# Patient Record
Sex: Female | Born: 1975 | Race: Black or African American | Hispanic: No | Marital: Married | Smoking: Never smoker
Health system: Southern US, Community
[De-identification: ages and names within clinical notes are randomized; demographics above are authoritative.]

## PROBLEM LIST (undated history)

## (undated) DIAGNOSIS — E785 Hyperlipidemia, unspecified: Secondary | ICD-10-CM

## (undated) DIAGNOSIS — L709 Acne, unspecified: Secondary | ICD-10-CM

## (undated) DIAGNOSIS — R55 Syncope and collapse: Secondary | ICD-10-CM

## (undated) DIAGNOSIS — I10 Essential (primary) hypertension: Secondary | ICD-10-CM

## (undated) DIAGNOSIS — D219 Benign neoplasm of connective and other soft tissue, unspecified: Secondary | ICD-10-CM

## (undated) DIAGNOSIS — N63 Unspecified lump in unspecified breast: Secondary | ICD-10-CM

## (undated) DIAGNOSIS — O1490 Unspecified pre-eclampsia, unspecified trimester: Secondary | ICD-10-CM

## (undated) DIAGNOSIS — B019 Varicella without complication: Secondary | ICD-10-CM

## (undated) HISTORY — DX: Acne, unspecified: L70.9

## (undated) HISTORY — DX: Varicella without complication: B01.9

## (undated) HISTORY — DX: Hyperlipidemia, unspecified: E78.5

## (undated) HISTORY — DX: Unspecified lump in unspecified breast: N63.0

## (undated) HISTORY — DX: Essential (primary) hypertension: I10

## (undated) HISTORY — DX: Syncope and collapse: R55

## (undated) HISTORY — DX: Benign neoplasm of connective and other soft tissue, unspecified: D21.9

## (undated) HISTORY — DX: Unspecified pre-eclampsia, unspecified trimester: O14.90

## (undated) SURGERY — Surgical Case
Anesthesia: Epidural

---

## 1997-09-04 ENCOUNTER — Other Ambulatory Visit: Admission: RE | Admit: 1997-09-04 | Discharge: 1997-09-04 | Payer: Self-pay | Admitting: Obstetrics & Gynecology

## 1998-09-04 ENCOUNTER — Other Ambulatory Visit: Admission: RE | Admit: 1998-09-04 | Discharge: 1998-09-04 | Payer: Self-pay | Admitting: Obstetrics & Gynecology

## 2001-05-16 DIAGNOSIS — I10 Essential (primary) hypertension: Secondary | ICD-10-CM

## 2001-05-16 DIAGNOSIS — O1490 Unspecified pre-eclampsia, unspecified trimester: Secondary | ICD-10-CM

## 2001-05-16 HISTORY — DX: Unspecified pre-eclampsia, unspecified trimester: O14.90

## 2001-05-16 HISTORY — DX: Essential (primary) hypertension: I10

## 2001-10-30 ENCOUNTER — Inpatient Hospital Stay (HOSPITAL_COMMUNITY): Admission: AD | Admit: 2001-10-30 | Discharge: 2001-11-03 | Payer: Self-pay | Admitting: Obstetrics and Gynecology

## 2010-12-15 ENCOUNTER — Other Ambulatory Visit (HOSPITAL_COMMUNITY)
Admission: RE | Admit: 2010-12-15 | Discharge: 2010-12-15 | Disposition: A | Discharge status: Home / Self Care | Payer: BC Managed Care – PPO | Source: Ambulatory Visit | Attending: Family Medicine | Admitting: Family Medicine

## 2010-12-15 ENCOUNTER — Ambulatory Visit (INDEPENDENT_AMBULATORY_CARE_PROVIDER_SITE_OTHER): Payer: BC Managed Care – PPO | Admitting: Family Medicine

## 2010-12-15 ENCOUNTER — Encounter: Payer: Self-pay | Admitting: Family Medicine

## 2010-12-15 VITALS — BP 120/82 | HR 66 | Temp 98.5°F | Ht 62.0 in | Wt 155.0 lb

## 2010-12-15 DIAGNOSIS — I10 Essential (primary) hypertension: Secondary | ICD-10-CM

## 2010-12-15 DIAGNOSIS — E785 Hyperlipidemia, unspecified: Secondary | ICD-10-CM

## 2010-12-15 DIAGNOSIS — Z0000 Encounter for general adult medical examination without abnormal findings: Secondary | ICD-10-CM

## 2010-12-15 DIAGNOSIS — Z01419 Encounter for gynecological examination (general) (routine) without abnormal findings: Secondary | ICD-10-CM | POA: Insufficient documentation

## 2010-12-15 MED ORDER — MINOCYCLINE HCL 50 MG PO TABS: 50.0000 mg | ORAL_TABLET | Freq: Two times a day (BID) | ORAL | Status: AC

## 2010-12-15 NOTE — Progress Notes (Signed)
Rebekah Brady, a 35 y.o. female presents today in the office for the following:    HTN, stable, compliant  PET when pregnany  Lost about forty pounds in the last few years.  Has had some plantar fascitis Fibroids on uterus  Walks a lot of cardio  Health Maintenance Summary Reviewed and updated, unless pt declines services.  Tobacco History Reviewed. Non-smoker Alcohol: No concerns, no excessive use Exercise Habits: Some activity, rec at least 30 mins 5 times a week Drug Use: None Birth control method: withdrawal Menses regular: yes - 23-28 day cycle Lumps or breast concerns: no Breast Cancer Family History: no  The PMH, PSH, Social History, Family History, Medications, and allergies have been reviewed in [facility], and have been updated if relevant.  General: Denies fever, chills, sweats. No significant weight loss. Eyes: Denies blurring,significant itching ENT: Denies earache, sore throat, and hoarseness.  Cardiovascular: Denies chest pains, palpitations, dyspnea on exertion,  Respiratory: Denies cough, dyspnea at rest,wheeezing Breast: no concerns about lumps GI: Denies nausea, vomiting, diarrhea, constipation, change in bowel habits, abdominal pain, melena, hematochezia GU: Denies dysuria, hematuria, urinary hesitancy, nocturia, denies STD risk, no concerns about discharge Musculoskeletal: Denies back pain, joint pain Derm: Denies rash, itching Neuro: Denies  paresthesias, frequent falls, frequent headaches Psych: Denies depression, anxiety Endocrine: Denies cold intolerance, heat intolerance, polydipsia Heme: Denies enlarged lymph nodes Allergy: No hayfever   Physical Exam  Blood pressure 120/82, pulse 66, temperature 98.5 F (36.9 C), temperature source Oral, height 5\' 2"  (1.575 m), weight 155 lb (70.308 kg), last menstrual period 12/15/2010, SpO2 99.00%.  GEN: well developed, well nourished, no acute distress Eyes: conjunctiva and lids normal, PERRLA, EOMI ENT: TM  clear, nares clear, oral exam WNL Neck: supple, no lymphadenopathy, no thyromegaly, no JVD Pulm: clear to auscultation and percussion, respiratory effort normal CV: regular rate and rhythm, S1-S2, no murmur, rub or gallop, no bruits, peripheral pulses normal and symmetric, no cyanosis, clubbing, edema or varicosities Chest: no scars, masses, no gynecomastia   BREAST: no lumps, no axillary LAD, no nipple discharge GI: soft, non-tender; no hepatosplenomegaly, masses; active bowel sounds all quadrants GU: Normal external female genitalia. Cervix appears intact without lesions or irritation. Vaginal canal normal without ulceration or lesion. Cervix NT to exam. Ovaries neither enlarged nor tender. Lymph: no cervical, axillary or inguinal adenopathy MSK: gait normal, muscle tone and strength WNL, no joint swelling, effusions, discoloration, crepitus  SKIN: clear, good turgor, color WNL, no rashes, lesions, or ulcerations Neuro: normal mental status, normal strength, sensation, and motion Psych: alert; oriented to person, place and time, normally interactive and not anxious or depressed in appearance.  Assessment and Plan: 1.  Health Maintenance: The patient's preventative maintenance and recommended screening tests for an annual wellness exam were reviewed in full today. Brought up to date unless services declined.  Counselled on the importance of diet, exercise, and its role in overall health and mortality. The patient's FH and SH was reviewed, including their home life, tobacco status, and drug and alcohol status. Pap and breast exam  2. Acne, reasonable to do a 3 month cycle of minocin

## 2010-12-17 ENCOUNTER — Encounter: Payer: Self-pay | Admitting: *Deleted

## 2011-03-24 ENCOUNTER — Encounter: Payer: Self-pay | Admitting: Family Medicine

## 2011-03-24 ENCOUNTER — Ambulatory Visit (INDEPENDENT_AMBULATORY_CARE_PROVIDER_SITE_OTHER): Payer: BC Managed Care – PPO | Admitting: Family Medicine

## 2011-03-24 VITALS — BP 126/78 | HR 72 | Temp 99.1°F | Wt 155.4 lb

## 2011-03-24 DIAGNOSIS — N898 Other specified noninflammatory disorders of vagina: Secondary | ICD-10-CM

## 2011-03-24 LAB — POCT WET PREP (WET MOUNT): Trichomonas Wet Prep HPF POC: NEGATIVE

## 2011-03-24 MED ORDER — FLUCONAZOLE 150 MG PO TABS: 150.0000 mg | ORAL_TABLET | Freq: Once | ORAL | Status: AC

## 2011-03-24 NOTE — Assessment & Plan Note (Signed)
Wet prep + yeast. Diflucan. Update if not improved.

## 2011-03-24 NOTE — Progress Notes (Signed)
  Subjective:    Patient ID: Rebekah Brady, female    DOB: December 03, 1975, 35 y.o.   MRN: [id number]  HPI CC: vag discharge  ?yeast infection.  Sxs started Saturday, took OTC med Sunday (something like vagisil), didn't really help.  + white discharge like lotion consistency, small amount.  + foul odor.  no fevers/chills, abd pain, itching or irritation, dysuria, urgency, frequency.  No new sexual partners.  No abx use recently.  Review of Systems Per HPI    Objective:   Physical Exam  Nursing note and vitals reviewed. Constitutional: She appears well-developed and well-nourished. No distress.  HENT:  Mouth/Throat: Oropharynx is clear and moist. No oropharyngeal exudate.  Abdominal: Soft. Bowel sounds are normal. She exhibits no distension. There is no tenderness. There is no rebound and no guarding.  Genitourinary:       Deferred.  Sample collected by pt.  Psychiatric: She has a normal mood and affect.      Assessment & Plan:

## 2011-03-24 NOTE — Patient Instructions (Signed)
Does look like yeast infection - diflucan sent to pharmacy. Update [place] if not improving as expected. Good to see you today, call us with questions.

## 2011-04-08 ENCOUNTER — Telehealth: Payer: Self-pay | Admitting: *Deleted

## 2011-04-08 MED ORDER — FLUCONAZOLE 150 MG PO TABS: 150.0000 mg | ORAL_TABLET | Freq: Once | ORAL | Status: AC

## 2011-04-08 NOTE — Telephone Encounter (Signed)
Ok to refill, diflucan 150 mg, #2.  Take 1 now, may repeat in 1 week if still symptomatic

## 2011-04-08 NOTE — Telephone Encounter (Signed)
Pt was given diflucan on 11/8 for yeast infection.  She says this hasn't yet cleared up and she is asking that a refill be sent to [facility] [address].

## 2011-04-08 NOTE — Telephone Encounter (Signed)
rx sent to pharmacy

## 2011-05-17 DIAGNOSIS — N63 Unspecified lump in unspecified breast: Secondary | ICD-10-CM

## 2011-05-17 HISTORY — DX: Unspecified lump in unspecified breast: N63.0

## 2011-05-23 ENCOUNTER — Telehealth: Payer: Self-pay | Admitting: *Deleted

## 2011-05-23 NOTE — Telephone Encounter (Signed)
Patient called with concerns regarding  HCTZ and the adverse effects on the kidneys. Please call to discuss.

## 2011-05-24 NOTE — Telephone Encounter (Signed)
Is pt aware he is not back till next week?

## 2011-05-24 NOTE — Telephone Encounter (Signed)
Patient advised.

## 2011-05-29 NOTE — Telephone Encounter (Signed)
Call --- I want you to call and speak to this patient directly, not a message and not on phone tree   Normally causes no problems unless an underlying kidney problem Why don't we have her come to OV so I can check her kidney function and address any additional concerns

## 2011-05-30 NOTE — Telephone Encounter (Signed)
Left message for patient to return my call.

## 2011-05-31 ENCOUNTER — Telehealth: Payer: Self-pay | Admitting: Internal Medicine

## 2011-05-31 NOTE — Telephone Encounter (Signed)
Patient has become aware that HCTZ can be potential dangerous for people with kidney disease and even though she hasn't been diagnosed with this her father and maternal grandmother has and wanted to know what your thoughts are on this because she has been on HCTZ for 9 years.

## 2011-05-31 NOTE — Telephone Encounter (Signed)
This patient called last week -- please have her come in for OV. We can check her kidney function, if it is normal, then there should be absolutely no problem taking a  diuretic

## 2011-05-31 NOTE — Telephone Encounter (Signed)
Patient advised and appt made

## 2011-05-31 NOTE — Telephone Encounter (Signed)
Unable to contact patient so left message for her to schedule appt

## 2011-06-02 ENCOUNTER — Ambulatory Visit (INDEPENDENT_AMBULATORY_CARE_PROVIDER_SITE_OTHER): Payer: BC Managed Care – PPO | Admitting: Family Medicine

## 2011-06-02 ENCOUNTER — Ambulatory Visit: Payer: BC Managed Care – PPO | Admitting: Family Medicine

## 2011-06-02 ENCOUNTER — Encounter: Payer: Self-pay | Admitting: Family Medicine

## 2011-06-02 VITALS — BP 120/82 | HR 84 | Temp 99.0°F | Ht 62.0 in | Wt 155.8 lb

## 2011-06-02 DIAGNOSIS — I10 Essential (primary) hypertension: Secondary | ICD-10-CM

## 2011-06-02 DIAGNOSIS — Z79899 Other long term (current) drug therapy: Secondary | ICD-10-CM

## 2011-06-02 NOTE — Progress Notes (Signed)
  Patient Name: Rebekah Brady Date of Birth: 11-Dec-1975 Age: 36 y.o. Medical Record Number: [id number] Gender: female Date of Encounter: 06/02/2011  History of Present Illness:  Rebekah Brady is a 36 y.o. very pleasant female patient who presents with the following:  Father has ESRD and on dialysis. DM, HTN, did not take care of himself.  MGM and on ESRD.  Had some PET in her 38th week of pregnancy.   Has also been on dilt for a long time, too.  Past Medical History, Surgical History, Social History, Family History, Problem List, Medications, and Allergies have been reviewed and updated if relevant.  Review of Systems:  GEN: No acute illnesses, no fevers, chills. GI: No n/v/d, eating normally Pulm: No SOB Interactive and getting along well at home.  Otherwise, ROS is as per the HPI.   Physical Examination: Filed Vitals:   06/02/11 1611  BP: 120/82  Pulse: 84  Temp: 99 F (37.2 C)  TempSrc: Oral  Height: 5\' 2"  (1.575 m)  Weight: 155 lb 12.8 oz (70.67 kg)  SpO2: 99%    Body mass index is 28.50 kg/(m^2).   GEN: WDWN, NAD, Non-toxic, A & O x 3 HEENT: Atraumatic, Normocephalic. Neck supple. No masses, No LAD. Ears and Nose: No external deformity. CV: RRR, No M/G/R. No JVD. No thrill. No extra heart sounds. PULM: CTA B, no wheezes, crackles, rhonchi. No retractions. No resp. distress. No accessory muscle use. EXTR: No c/c/e NEURO Normal gait.  PSYCH: Normally interactive. Conversant. Not depressed or anxious appearing.  Calm demeanor.    Assessment and Plan: 1. Encounter for long-term (current) use of other medications  Basic metabolic panel   HTN:  >15 minutes spent in face to face time with patient, >50% spent in counselling or coordination of care: reviewed the role of hydrochlorothiazide in the management of blood pressure. Answered all of her questions. The patient did have some worries and concerns given that her father has end-stage renal failure and is on  dialysis. Answered all of her questions to the best of my ability.

## 2011-06-03 LAB — BASIC METABOLIC PANEL
CO2: 26 mEq/L (ref 19–32)
Calcium: 9 mg/dL (ref 8.4–10.5)
Chloride: 100 mEq/L (ref 96–112)
Potassium: 3.7 mEq/L (ref 3.5–5.1)
Sodium: 137 mEq/L (ref 135–145)

## 2011-08-17 ENCOUNTER — Other Ambulatory Visit: Payer: Self-pay

## 2011-08-17 MED ORDER — HYDROCHLOROTHIAZIDE 25 MG PO TABS: 25.0000 mg | ORAL_TABLET | Freq: Every day | ORAL | Status: DC

## 2011-08-17 NOTE — Telephone Encounter (Signed)
Pt request refill HCTZ 25 mg #30 only because pt is waiting on rx from mail order pharmacy to be sent to [facility] [address]. Pt notified refill sent to [facility]. Pt last seen 06/02/11.

## 2011-09-26 ENCOUNTER — Other Ambulatory Visit: Payer: Self-pay

## 2011-09-26 MED ORDER — HYDROCHLOROTHIAZIDE 25 MG PO TABS: 25.0000 mg | ORAL_TABLET | Freq: Every day | ORAL | Status: DC

## 2011-09-26 NOTE — Telephone Encounter (Signed)
Pt left v/m needed refill. Left v/m for pt to call back.

## 2011-09-26 NOTE — Telephone Encounter (Signed)
Pt waiting on med from mail order and request HCTZ 25 mg # 30 x 0 sent to [facility] [address].Pt last seen 06/02/11.pt notified refilled while on phone.

## 2011-10-20 ENCOUNTER — Other Ambulatory Visit: Payer: Self-pay

## 2011-10-20 MED ORDER — HYDROCHLOROTHIAZIDE 25 MG PO TABS: 25.0000 mg | ORAL_TABLET | Freq: Every day | ORAL | Status: DC

## 2011-10-20 MED ORDER — DILTIAZEM HCL ER COATED BEADS 240 MG PO CP24: 240.0000 mg | ORAL_CAPSULE | Freq: Every day | ORAL | Status: DC

## 2011-10-20 NOTE — Telephone Encounter (Signed)
Pt request diltiazem 240 mg # 90 x 2 and HCTZ 25 mg # [facility] Pt notified while on phone done.

## 2011-12-29 ENCOUNTER — Ambulatory Visit: Payer: Self-pay | Admitting: Family Medicine

## 2012-01-23 HISTORY — PX: BREAST CYST ASPIRATION: SHX578

## 2012-06-14 ENCOUNTER — Encounter: Payer: Self-pay | Admitting: General Surgery

## 2012-06-14 DIAGNOSIS — N63 Unspecified lump in unspecified breast: Secondary | ICD-10-CM | POA: Insufficient documentation

## 2012-06-15 ENCOUNTER — Encounter: Payer: Self-pay | Admitting: *Deleted

## 2012-07-15 ENCOUNTER — Other Ambulatory Visit: Payer: Self-pay | Admitting: Family Medicine

## 2012-07-16 NOTE — Telephone Encounter (Signed)
[facility]ill diltiazem 240 mg # 90 x 0.

## 2012-08-16 ENCOUNTER — Other Ambulatory Visit: Payer: Self-pay

## 2012-08-16 ENCOUNTER — Encounter: Payer: Self-pay | Admitting: General Surgery

## 2012-08-16 ENCOUNTER — Ambulatory Visit (INDEPENDENT_AMBULATORY_CARE_PROVIDER_SITE_OTHER): Payer: BC Managed Care – PPO | Admitting: General Surgery

## 2012-08-16 VITALS — BP 110/80 | HR 68 | Resp 12 | Ht 62.5 in | Wt 159.0 lb

## 2012-08-16 DIAGNOSIS — N63 Unspecified lump in unspecified breast: Secondary | ICD-10-CM

## 2012-08-16 DIAGNOSIS — Z1231 Encounter for screening mammogram for malignant neoplasm of breast: Secondary | ICD-10-CM

## 2012-08-16 NOTE — Patient Instructions (Addendum)
Follow up 6 months with screening mammogram([facility]) and office visit with office ultrasound.

## 2012-08-16 NOTE — Progress Notes (Signed)
Patient ID: Rebekah Brady, female   DOB: August 06, 1975, 37 y.o.   MRN: [id number]  Chief Complaint  Patient presents with  . Follow-up    breast    HPI Rebekah Brady is a 37 y.o. female.  Patient here today for 6 month follow up right breast aspiration that was benign done 01-23-12. No new breast complaints. HPI  Past Medical History  Diagnosis Date  . Chicken pox   . Hyperlipidemia   . Fibroids     Uterine, dx on U/S  . HTN (hypertension) 2003  . Preeclampsia 2003  . Breast lump 2013    right breast     Past Surgical History  Procedure Laterality Date  . Breast biopsy Right 01/23/2012    Family History  Problem Relation Age of Onset  . Skin cancer Father   . Prostate cancer Maternal Grandfather     Social History History  Substance Use Topics  . Smoking status: Never Smoker   . Smokeless tobacco: Never Used  . Alcohol Use: Yes     Comment: occasionally    No Known Allergies  Current Outpatient Prescriptions  Medication Sig Dispense Refill  . diltiazem (CARDIZEM CD) 240 MG 24 hr capsule TAKE 1 CAPSULE DAILY  90 capsule  0  . DORYX 200 MG TBEC Take by mouth daily.       Marland Kitchen FLUoxetine (PROZAC) 10 MG tablet Take 10 mg by mouth as needed.       . hydrochlorothiazide (HYDRODIURIL) 25 MG tablet Take 1 tablet (25 mg total) by mouth daily.  90 tablet  2   No current facility-administered medications for this visit.    Review of Systems Review of Systems  Constitutional: Negative.   Respiratory: Negative.     Blood pressure 110/80, pulse 68, resp. rate 12, height 5' 2.5" (1.588 m), weight 159 lb (72.122 kg), last menstrual period 08/12/2012.  Physical Exam Physical Exam  Constitutional: She is oriented to person, place, and time. She appears well-developed and well-nourished.  Cardiovascular: Normal rate and regular rhythm.   Pulmonary/Chest: Effort normal and breath sounds normal. Right breast exhibits no inverted nipple, no mass, no nipple discharge, no skin change and  no tenderness. Left breast exhibits no inverted nipple, no mass, no nipple discharge, no skin change and no tenderness. Breasts are asymmetrical.  Lymphadenopathy:    She has no cervical adenopathy.    She has no axillary adenopathy.  Neurological: She is alert and oriented to person, place, and time.  Skin: Skin is warm and dry.   Right > left breast Data Reviewed Ultrasound examination of the right breast in the 10:00 position, 5 cm from the nipple showed a smoothly marginated hypoechoic nodule measuring 0.47 x 0.69 x 0.79 cm. Thank acoustic enhancement was identified. This area measured 0.49 x 0.70 x 0.71 at the time of her evaluation 6 months ago. Previous cytology was benign.  Assessment    Right breast nodule, unchanged over 6 months.     Plan    Arrangements were made for a followup examination in 6 months with repeat bilateral mammograms. Because of cost considerations, a screening study will be completed at [facility] side [facility] with office visit and ultrasound follow.       Earline Mayotte 08/16/2012, 9:57 PM

## 2013-01-29 ENCOUNTER — Other Ambulatory Visit: Payer: BC Managed Care – PPO

## 2013-01-29 ENCOUNTER — Inpatient Hospital Stay
Admission: RE | Admit: 2013-01-29 | Discharge: 2013-01-29 | Disposition: A | Discharge status: Home / Self Care | Payer: Self-pay | Source: Ambulatory Visit | Attending: General Surgery | Admitting: General Surgery

## 2013-01-29 ENCOUNTER — Encounter: Payer: Self-pay | Admitting: General Surgery

## 2013-01-29 ENCOUNTER — Ambulatory Visit (INDEPENDENT_AMBULATORY_CARE_PROVIDER_SITE_OTHER): Payer: BC Managed Care – PPO | Admitting: General Surgery

## 2013-01-29 VITALS — BP 128/84 | HR 68 | Resp 13 | Ht 62.0 in | Wt 157.0 lb

## 2013-01-29 DIAGNOSIS — N6009 Solitary cyst of unspecified breast: Secondary | ICD-10-CM

## 2013-01-29 DIAGNOSIS — N6001 Solitary cyst of right breast: Secondary | ICD-10-CM

## 2013-01-29 DIAGNOSIS — N63 Unspecified lump in unspecified breast: Secondary | ICD-10-CM

## 2013-01-29 NOTE — Patient Instructions (Signed)
Continue self breast exams. Call office for any new breast issues or concerns. 

## 2013-01-29 NOTE — Progress Notes (Signed)
aPatient ID: Rebekah Brady, female   DOB: 1976-05-04, 37 y.o.   MRN: [id number]  Chief Complaint  Patient presents with  . Follow-up    mammogram    HPI Rebekah Brady is a 37 y.o. female who presents for a breast evaluation. The most recent mammogram was done at [facility] on 12/27/12 Cat 2 .The patient does perform regular self breast checks and gets regular mammograms done. She reports no new breast symptoms    HPI  Past Medical History  Diagnosis Date  . Chicken pox   . Hyperlipidemia   . Fibroids     Uterine, dx on U/S  . HTN (hypertension) 2003  . Preeclampsia 2003  . Breast lump 2013    right breast   . Acne     Past Surgical History  Procedure Laterality Date  . Breast biopsy Right 01/23/2012    Family History  Problem Relation Age of Onset  . Skin cancer Father   . Prostate cancer Maternal Grandfather     Social History History  Substance Use Topics  . Smoking status: Never Smoker   . Smokeless tobacco: Never Used  . Alcohol Use: Yes     Comment: occasionally    No Known Allergies  Current Outpatient Prescriptions  Medication Sig Dispense Refill  . Dapsone (ACZONE) 5 % topical gel Apply topically daily.      Marland Kitchen diltiazem (CARDIZEM CD) 240 MG 24 hr capsule Take 240 mg by mouth daily.      Marland Kitchen FLUoxetine (PROZAC) 10 MG capsule Take 10 mg by mouth as needed.      . hydrochlorothiazide (HYDRODIURIL) 25 MG tablet Take 25 mg by mouth daily.      Marland Kitchen tretinoin (RETIN-A) 0.01 % gel Apply topically at bedtime.       No current facility-administered medications for this visit.    Review of Systems Review of Systems  Constitutional: Negative.   Respiratory: Negative.   Cardiovascular: Negative.     Blood pressure 128/84, pulse 68, resp. rate 13, height 5\' 2"  (1.575 m), weight 157 lb (71.215 kg), last menstrual period 01/09/2013.  Physical Exam Physical Exam  Constitutional: She is oriented to person, place, and time. She appears well-developed and well-nourished.   Neck: Neck supple.  Cardiovascular: Normal rate, regular rhythm and normal heart sounds.   Pulmonary/Chest: Effort normal and breath sounds normal. Right breast exhibits no inverted nipple, no mass, no nipple discharge, no skin change and no tenderness. Left breast exhibits no inverted nipple, no mass, no nipple discharge, no skin change and no tenderness.  Lymphadenopathy:    She has no cervical adenopathy.  Neurological: She is alert and oriented to person, place, and time.    Data Reviewed Ultrasound examination of the right breast 11:00 position 5 cm level showed an anechoic/hypoechoic softly lobulated mass measuring 0.5 x 0.65 x 0.74 cm. No clear-cut acoustic enhancement or shadowing.  Previous FNA completed in September 2013 was benign. At that time the lesion measured 0.5 x 0.7 x 0.7 cm.  Mammograms dated December 27, 2012 showed heterogeneously dense breasts with a stable nodule on the right. I read-2.  Assessment    Benign breast exam.     Plan    The patient will continue annual exams with her primary care provider. She was encouraged to call should she appreciate any changes on her own breast exam.        Earline Mayotte 01/30/2013, 10:35 PM

## 2013-01-30 ENCOUNTER — Encounter: Payer: Self-pay | Admitting: General Surgery

## 2013-05-04 ENCOUNTER — Emergency Department: Payer: Self-pay | Admitting: Emergency Medicine

## 2013-05-04 LAB — COMPREHENSIVE METABOLIC PANEL
Albumin: 3.8 g/dL (ref 3.4–5.0)
Alkaline Phosphatase: 68 U/L
BUN: 12 mg/dL (ref 7–18)
Calcium, Total: 9 mg/dL (ref 8.5–10.1)
Chloride: 105 mmol/L (ref 98–107)
Co2: 29 mmol/L (ref 21–32)
Creatinine: 0.49 mg/dL — ABNORMAL LOW (ref 0.60–1.30)
Osmolality: 273 (ref 275–301)
SGOT(AST): 48 U/L — ABNORMAL HIGH (ref 15–37)
SGPT (ALT): 23 U/L (ref 12–78)

## 2013-05-04 LAB — URINALYSIS, COMPLETE
Bilirubin,UR: NEGATIVE
Blood: NEGATIVE
Ketone: NEGATIVE
Nitrite: NEGATIVE
RBC,UR: <1 /HPF (ref 0–5)
Specific Gravity: 1.017 (ref 1.003–1.030)
Squamous Epithelial: 7
WBC UR: 1 /HPF (ref 0–5)

## 2013-05-04 LAB — TROPONIN I: Troponin-I: <0.02 ng/mL

## 2013-05-04 LAB — ETHANOL: Ethanol: <3 mg/dL

## 2013-05-04 LAB — CBC
MCHC: 33.1 g/dL (ref 32.0–36.0)
MCV: 101 fL — ABNORMAL HIGH (ref 80–100)
RBC: 4.35 10*6/uL (ref 3.80–5.20)

## 2013-05-08 ENCOUNTER — Encounter: Payer: Self-pay | Admitting: Cardiovascular Disease

## 2013-05-08 ENCOUNTER — Ambulatory Visit (INDEPENDENT_AMBULATORY_CARE_PROVIDER_SITE_OTHER): Payer: BC Managed Care – PPO | Admitting: Cardiovascular Disease

## 2013-05-08 ENCOUNTER — Encounter (INDEPENDENT_AMBULATORY_CARE_PROVIDER_SITE_OTHER): Payer: Self-pay

## 2013-05-08 VITALS — BP 129/84 | HR 78 | Ht 62.0 in | Wt 160.5 lb

## 2013-05-08 DIAGNOSIS — R55 Syncope and collapse: Secondary | ICD-10-CM | POA: Insufficient documentation

## 2013-05-08 DIAGNOSIS — I10 Essential (primary) hypertension: Secondary | ICD-10-CM

## 2013-05-08 NOTE — Assessment & Plan Note (Signed)
Blood pressure is well controlled on current medications. 

## 2013-05-08 NOTE — Patient Instructions (Signed)
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 Still well hydrated and avoid situations that can lead to it.   Follow up as needed.

## 2013-05-08 NOTE — Progress Notes (Signed)
HPI  This is a pleasant 37 year old female who was referred from the emergency room at [facility] for evaluation of syncope and abnormal EKG. She has no previous cardiac history. She has known history of hypertension for at least 10 years duration which started after she had preeclampsia. There is strong family history of hypertension but no premature coronary artery disease or sudden death. She reports recurrent episodes of syncope throughout her life. One time happened while she was on a cruise in a hot weather with feeling sick in her stomach. A similar episode happen when she was on an air plane. She had presyncopal episodes with blood draws. On Friday, she had more alcohol than usual. She felt nauseous and try to up to the bathroom but then had a syncopal episode which was witnessed by her husband. It was brief. There was no confusion after the episode. EMS were called but did not come to the scene. She went to the emergency room the next day. Labs were unremarkable. EKG showed mildly prolonged QT interval at 469 ms. Potassium was 4.3. CT scan of the head showed no acute abnormalities. Urinalysis was unremarkable. Troponin was normal. She feels back to her normal self.  No Known Allergies   Current Outpatient Prescriptions on File Prior to Visit  Medication Sig Dispense Refill  . Dapsone (ACZONE) 5 % topical gel Apply topically daily.      Marland Kitchen diltiazem (CARDIZEM CD) 240 MG 24 hr capsule Take 240 mg by mouth daily.      . hydrochlorothiazide (HYDRODIURIL) 25 MG tablet Take 25 mg by mouth daily.       No current facility-administered medications on file prior to visit.     Past Medical History  Diagnosis Date  . Chicken pox   . Hyperlipidemia   . Fibroids     Uterine, dx on U/S  . Breast lump 2013    right breast   . Acne   . Syncope and collapse   . HTN (hypertension) 2003  . Preeclampsia 2003     Past Surgical History  Procedure Laterality Date  . Breast biopsy Right 01/23/2012      Family History  Problem Relation Age of Onset  . Skin cancer Father   . Hypertension Father   . Prostate cancer Maternal Grandfather   . Hypertension Mother      History   Social History  . Marital Status: Married    Spouse Name: N/A    Number of Children: N/A  . Years of Education: N/A   Occupational History  . educator    Social History Main Topics  . Smoking status: Never Smoker   . Smokeless tobacco: Never Used  . Alcohol Use: Yes     Comment: occasionally  . Drug Use: No  . Sexual Activity: Not on file   Other Topics Concern  . Not on file   Social History Narrative  . No narrative on file     ROS A 10 point review of system was performed. It is negative other than that mentioned in the history of present illness.   PHYSICAL EXAM   BP 129/84  Pulse 78  Ht 5\' 2"  (1.575 m)  Wt 160 lb 8 oz (72.802 kg)  BMI 29.35 kg/m2 Constitutional: She is oriented to person, place, and time. She appears well-developed and well-nourished. No distress.  HENT: No nasal discharge.  Head: Normocephalic and atraumatic.  Eyes: Pupils are equal and round. No discharge.  Neck: Normal range  of motion. Neck supple. No JVD present. No thyromegaly present.  Cardiovascular: Normal rate, regular rhythm, normal heart sounds. Exam reveals no gallop and no friction rub. No murmur heard.  Pulmonary/Chest: Effort normal and breath sounds normal. No stridor. No respiratory distress. She has no wheezes. She has no rales. She exhibits no tenderness.  Abdominal: Soft. Bowel sounds are normal. She exhibits no distension. There is no tenderness. There is no rebound and no guarding.  Musculoskeletal: Normal range of motion. She exhibits no edema and no tenderness.  Neurological: She is alert and oriented to person, place, and time. Coordination normal.  Skin: Skin is warm and dry. No rash noted. She is not diaphoretic. No erythema. No pallor.  Psychiatric: She has a normal mood and affect.  Her behavior is normal. Judgment and thought content normal.     EKG: Normal sinus rhythm with sinus arrhythmia. No significant ST or T wave changes. Normal QT interval at 433 ms.   ASSESSMENT AND PLAN

## 2013-05-08 NOTE — Assessment & Plan Note (Signed)
The patient's history is highly suggestive of vasovagal syncope. Physical exam is normal and EKG is back to normal. Recent EKG showed slightly prolonged QT interval which could have been due to electrolyte abnormalities after excessive alcohol use. I discussed with her the natural history and management of vasovagal syncope. No further cardiac workup is recommended at this point.

## 2014-03-17 ENCOUNTER — Encounter: Payer: Self-pay | Admitting: Cardiovascular Disease

## 2015-12-17 LAB — OB RESULTS CONSOLE VARICELLA ZOSTER ANTIBODY, IGG: Varicella: IMMUNE

## 2015-12-17 LAB — OB RESULTS CONSOLE HEPATITIS B SURFACE ANTIGEN: Hepatitis B Surface Ag: NEGATIVE

## 2015-12-17 LAB — OB RESULTS CONSOLE RUBELLA ANTIBODY, IGM: Rubella: IMMUNE

## 2016-01-25 ENCOUNTER — Ambulatory Visit
Admission: RE | Admit: 2016-01-25 | Discharge: 2016-01-25 | Disposition: A | Discharge status: Home / Self Care | Payer: BC Managed Care – PPO | Source: Ambulatory Visit | Attending: Obstetrics and Gynecology | Admitting: Obstetrics and Gynecology

## 2016-01-25 DIAGNOSIS — Z029 Encounter for administrative examinations, unspecified: Secondary | ICD-10-CM | POA: Insufficient documentation

## 2016-05-06 LAB — OB RESULTS CONSOLE GBS: GBS: NEGATIVE

## 2016-05-06 LAB — OB RESULTS CONSOLE HIV ANTIBODY (ROUTINE TESTING): HIV: NONREACTIVE

## 2016-05-06 LAB — OB RESULTS CONSOLE RPR: RPR: NONREACTIVE

## 2016-06-27 ENCOUNTER — Other Ambulatory Visit: Payer: Self-pay | Admitting: Obstetrics and Gynecology

## 2016-06-27 DIAGNOSIS — O24410 Gestational diabetes mellitus in pregnancy, diet controlled: Secondary | ICD-10-CM

## 2016-06-27 DIAGNOSIS — O24419 Gestational diabetes mellitus in pregnancy, unspecified control: Secondary | ICD-10-CM

## 2016-06-27 DIAGNOSIS — Z362 Encounter for other antenatal screening follow-up: Secondary | ICD-10-CM

## 2016-07-04 ENCOUNTER — Ambulatory Visit: Payer: BC Managed Care – PPO

## 2016-07-13 ENCOUNTER — Inpatient Hospital Stay
Admission: EM | Admit: 2016-07-13 | Discharge: 2016-07-18 | DRG: 766 | Disposition: A | Discharge status: Home / Self Care | Payer: BC Managed Care – PPO | Attending: Obstetrics and Gynecology | Admitting: Obstetrics and Gynecology

## 2016-07-13 DIAGNOSIS — I10 Essential (primary) hypertension: Secondary | ICD-10-CM | POA: Diagnosis present

## 2016-07-13 DIAGNOSIS — O24420 Gestational diabetes mellitus in childbirth, diet controlled: Secondary | ICD-10-CM | POA: Diagnosis present

## 2016-07-13 DIAGNOSIS — Z3A38 38 weeks gestation of pregnancy: Secondary | ICD-10-CM

## 2016-07-13 DIAGNOSIS — O1002 Pre-existing essential hypertension complicating childbirth: Principal | ICD-10-CM | POA: Diagnosis present

## 2016-07-13 DIAGNOSIS — O163 Unspecified maternal hypertension, third trimester: Secondary | ICD-10-CM

## 2016-07-13 DIAGNOSIS — O10913 Unspecified pre-existing hypertension complicating pregnancy, third trimester: Secondary | ICD-10-CM | POA: Diagnosis present

## 2016-07-13 LAB — CBC
HEMATOCRIT: 36.3 % (ref 35.0–47.0)
Hemoglobin: 12.5 g/dL (ref 12.0–16.0)
MCH: 31.7 pg (ref 26.0–34.0)
MCHC: 34.3 g/dL (ref 32.0–36.0)
MCV: 92.2 fL (ref 80.0–100.0)
PLATELETS: 413 10*3/uL (ref 150–440)
RBC: 3.94 MIL/uL (ref 3.80–5.20)
RDW: 14.8 % — AB (ref 11.5–14.5)
WBC: 9.1 10*3/uL (ref 3.6–11.0)

## 2016-07-13 LAB — COMPREHENSIVE METABOLIC PANEL
ALBUMIN: 2.9 g/dL — AB (ref 3.5–5.0)
ALT: 19 U/L (ref 14–54)
ANION GAP: 7 (ref 5–15)
AST: 31 U/L (ref 15–41)
Alkaline Phosphatase: 165 U/L — ABNORMAL HIGH (ref 38–126)
BUN: 9 mg/dL (ref 6–20)
CHLORIDE: 107 mmol/L (ref 101–111)
CO2: 22 mmol/L (ref 22–32)
Calcium: 9 mg/dL (ref 8.9–10.3)
Creatinine, Ser: 0.8 mg/dL (ref 0.44–1.00)
GFR calc non Af Amer: >60 mL/min (ref >60)
GLUCOSE: 100 mg/dL — AB (ref 65–99)
Potassium: 3.8 mmol/L (ref 3.5–5.1)
SODIUM: 136 mmol/L (ref 135–145)
Total Bilirubin: 0.6 mg/dL (ref 0.3–1.2)
Total Protein: 6.4 g/dL — ABNORMAL LOW (ref 6.5–8.1)

## 2016-07-13 MED ORDER — BUTORPHANOL TARTRATE 1 MG/ML IJ SOLN
1.0000 mg | INTRAMUSCULAR | Status: DC | PRN
  Administered 2016-07-14 (×2): 1 mg via INTRAVENOUS
  Filled 2016-07-13 (×2): qty 1

## 2016-07-13 MED ORDER — ONDANSETRON HCL 4 MG/2ML IJ SOLN
4.0000 mg | Freq: Four times a day (QID) | INTRAMUSCULAR | Status: DC | PRN
  Administered 2016-07-14: 4 mg via INTRAVENOUS
  Filled 2016-07-13: qty 2

## 2016-07-13 MED ORDER — TERBUTALINE SULFATE 1 MG/ML IJ SOLN: 0.2500 mg | Freq: Once | INTRAMUSCULAR | Status: DC | PRN

## 2016-07-13 MED ORDER — LACTATED RINGERS IV SOLN
INTRAVENOUS | Status: DC
  Administered 2016-07-13 – 2016-07-14 (×2): via INTRAVENOUS

## 2016-07-13 MED ORDER — OXYTOCIN 40 UNITS IN LACTATED RINGERS INFUSION - SIMPLE MED
2.5000 [IU]/h | INTRAVENOUS | Status: DC
  Filled 2016-07-13: qty 1000

## 2016-07-13 MED ORDER — ZOLPIDEM TARTRATE 5 MG PO TABS: 5.0000 mg | ORAL_TABLET | Freq: Every evening | ORAL | Status: DC | PRN

## 2016-07-13 MED ORDER — LACTATED RINGERS IV SOLN
500.0000 mL | INTRAVENOUS | Status: DC | PRN
  Administered 2016-07-14: 500 mL via INTRAVENOUS

## 2016-07-13 MED ORDER — OXYTOCIN BOLUS FROM INFUSION: 500.0000 mL | Freq: Once | INTRAVENOUS | Status: DC

## 2016-07-13 MED ORDER — DINOPROSTONE 10 MG VA INST
10.0000 mg | VAGINAL_INSERT | Freq: Once | VAGINAL | Status: AC
  Administered 2016-07-13: 10 mg via VAGINAL
  Filled 2016-07-13: qty 1

## 2016-07-13 MED ORDER — ACETAMINOPHEN 325 MG PO TABS: 650.0000 mg | ORAL_TABLET | ORAL | Status: DC | PRN

## 2016-07-13 NOTE — H&P (Signed)
Obstetrics Admission History & Physical   CC: cHTN affecting pregnancy (38 weeks)   HPI:  41 y.o. G2P1 @ [redacted]w[redacted]d (07/26/2016, by Patient Reported). Admitted on 07/13/2016:   Patient Active Problem List   Diagnosis Date Noted  . [redacted] weeks gestation of pregnancy 07/13/2016  . Hypertension affecting pregnancy in third trimester 07/13/2016  . Vasovagal syncope 05/08/2013  . Breast cyst 01/29/2013  . Breast lump   . Vaginal discharge 03/24/2011  . HTN (hypertension)   . Hyperlipidemia      Presents for IOL at 38 weeks due to pregnancy complicated by GDMA1, cHTN no meds, and AMA normal cfDNA XX.  No pain or bleeding or ROM.   Prenatal care at: at [facility]. Pregnancy complicated by chronic HTN, gestational DM.  ROS: A review of systems was performed and negative, except as stated in the above HPI. PMHx:  Past Medical History:  Diagnosis Date  . Acne   . Breast lump 2013   right breast   . Chicken pox   . Fibroids    Uterine, dx on U/S  . HTN (hypertension) 2003  . Hyperlipidemia   . Preeclampsia 2003  . Syncope and collapse    PSHx:  Past Surgical History:  Procedure Laterality Date  . BREAST BIOPSY Right 01/23/2012   Medications:  Prescriptions Prior to Admission  Medication Sig Dispense Refill Last Dose  . Dapsone (ACZONE) 5 % topical gel Apply topically daily.   Not Taking at Unknown time  . diltiazem (CARDIZEM CD) 240 MG 24 hr capsule Take 240 mg by mouth daily.   Not Taking at Unknown time  . hydrochlorothiazide (HYDRODIURIL) 25 MG tablet Take 25 mg by mouth daily.   Not Taking at Unknown time  . Levonorgestrel (MIRENA IU) by Intrauterine route.   Not Taking at Unknown time  . tretinoin (RETIN-A) 0.05 % cream Apply topically at bedtime.   Not Taking at Unknown time   Allergies: has No Known Allergies. OBHx:  OB History  Gravida Para Term Preterm AB Living  2 1       1   SAB TAB Ectopic Multiple Live Births               # Outcome Date GA Lbr Len/2nd Weight Sex  Delivery Anes PTL Lv  2 Current           1 Para             Obstetric Comments  Age first period 62  Age first pregnancy 61   [id number] except as detailed in HPI.Marland Kitchen  No family history of birth defects. Soc Hx: Never smoker, Alcohol: none and Recreational drug use: none  Objective:   Vitals:   07/13/16 2058  BP: (!) 149/100  Pulse: 95  Resp: 18  Temp: 99.3 F (37.4 C)   Constitutional: Well nourished, well developed female in no acute distress.  HEENT: normal Skin: Warm and dry.  Cardiovascular:Regular rate and rhythm.   Extremity: trace to 1+ bilateral pedal edema Respiratory: Clear to auscultation bilateral. Normal respiratory effort Abdomen: gravid NT ND Back: no CVAT Neuro: DTRs 2+, Cranial nerves grossly intact Psych: Alert and Oriented x3. No memory deficits. Normal mood and affect.  MS: normal gait, normal bilateral lower extremity ROM/strength/stability.  Pelvic exam: is not limited by body habitus EGBUS: within normal limits Vagina: within normal limits and with normal mucosa blood in the vault Cervix: not evaluated Uterus: Not evaluated.  Adnexa: not evaluated  EFM:FHR: 140 bpm, variability: moderate,  accelerations:  Present,  decelerations:  Absent Toco: None   Perinatal info:  Blood type: A positive Rubella- Immune Varicella -Immune TDaP Given during third trimester of this pregnancy RPR NR / HIV Neg/ HBsAg Neg   Assessment & Plan:   41 y.o. G2P1 @ [redacted]w[redacted]d, Admitted on 07/13/2016: IOL due to [place], [place], and AMA.   Cervadil planned tonight.  Labs for baseline due to HTN history.  Monitor BPs.  Admit for labor, Observe for cervical change, Fetal Wellbeing Reassuring, Epidural when ready and AROM when Appropriate

## 2016-07-14 ENCOUNTER — Encounter
Admission: EM | Disposition: A | Discharge status: Home / Self Care | Payer: Self-pay | Source: Home / Self Care | Attending: Obstetrics and Gynecology

## 2016-07-14 ENCOUNTER — Inpatient Hospital Stay: Payer: BC Managed Care – PPO | Admitting: Anesthesiology

## 2016-07-14 LAB — CBC
HEMATOCRIT: 36.9 % (ref 35.0–47.0)
HEMOGLOBIN: 12.5 g/dL (ref 12.0–16.0)
MCH: 31.7 pg (ref 26.0–34.0)
MCHC: 33.9 g/dL (ref 32.0–36.0)
MCV: 93.5 fL (ref 80.0–100.0)
Platelets: 376 10*3/uL (ref 150–440)
RBC: 3.94 MIL/uL (ref 3.80–5.20)
RDW: 14.8 % — AB (ref 11.5–14.5)
WBC: 9.6 10*3/uL (ref 3.6–11.0)

## 2016-07-14 LAB — PROTEIN / CREATININE RATIO, URINE
CREATININE, URINE: 79 mg/dL
Protein Creatinine Ratio: 0.08 mg/mg{Cre} (ref 0.00–0.15)
Total Protein, Urine: 6 mg/dL

## 2016-07-14 LAB — GLUCOSE, CAPILLARY
GLUCOSE-CAPILLARY: 112 mg/dL — AB (ref 65–99)
Glucose-Capillary: 83 mg/dL (ref 65–99)
Glucose-Capillary: 68 mg/dL (ref 65–99)
Glucose-Capillary: 65 mg/dL (ref 65–99)
Glucose-Capillary: 107 mg/dL — ABNORMAL HIGH (ref 65–99)

## 2016-07-14 LAB — RAPID HIV SCREEN (HIV 1/2 AB+AG)
HIV 1/2 Antibodies: NONREACTIVE
HIV-1 P24 Antigen - HIV24: NONREACTIVE

## 2016-07-14 LAB — TYPE AND SCREEN
ABO/RH(D): A POS
Antibody Screen: NEGATIVE

## 2016-07-14 SURGERY — Surgical Case
Anesthesia: General

## 2016-07-14 MED ORDER — BUPIVACAINE HCL (PF) 0.25 % IJ SOLN
8.0000 mL | Freq: Once | INTRAMUSCULAR | Status: DC
  Filled 2016-07-14: qty 10

## 2016-07-14 MED ORDER — LIDOCAINE-EPINEPHRINE (PF) 1.5 %-1:200000 IJ SOLN
INTRAMUSCULAR | Status: DC | PRN
  Administered 2016-07-14: 3 mL via EPIDURAL

## 2016-07-14 MED ORDER — FENTANYL 2.5 MCG/ML W/ROPIVACAINE 0.2% IN NS 100 ML EPIDURAL INFUSION (ARMC-ANES)
EPIDURAL | Status: AC
  Filled 2016-07-14: qty 100

## 2016-07-14 MED ORDER — SODIUM CHLORIDE 0.9 % IJ SOLN
INTRAMUSCULAR | Status: AC
  Filled 2016-07-14: qty 10

## 2016-07-14 MED ORDER — SOD CITRATE-CITRIC ACID 500-334 MG/5ML PO SOLN
ORAL | Status: AC
  Administered 2016-07-14: 30 mL via ORAL
  Filled 2016-07-14: qty 15

## 2016-07-14 MED ORDER — EPHEDRINE SULFATE 50 MG/ML IJ SOLN
INTRAMUSCULAR | Status: AC
  Administered 2016-07-14: 5 mg
  Administered 2016-07-14: 20 mg
  Administered 2016-07-14: 5 mg
  Filled 2016-07-14: qty 1

## 2016-07-14 MED ORDER — LIDOCAINE HCL (PF) 1 % IJ SOLN
INTRAMUSCULAR | Status: DC | PRN
  Administered 2016-07-14: 3 mL via SUBCUTANEOUS

## 2016-07-14 MED ORDER — BUPIVACAINE HCL (PF) 0.25 % IJ SOLN
INTRAMUSCULAR | Status: DC | PRN
  Administered 2016-07-14 (×2): 5 mL via EPIDURAL

## 2016-07-14 MED ORDER — CEFAZOLIN IN D5W 1 GM/50ML IV SOLN
INTRAVENOUS | Status: AC
  Filled 2016-07-14: qty 100

## 2016-07-14 MED ORDER — TERBUTALINE SULFATE 1 MG/ML IJ SOLN
0.2500 mg | Freq: Once | INTRAMUSCULAR | Status: AC | PRN
  Administered 2016-07-15: 0.25 mg via SUBCUTANEOUS
  Filled 2016-07-14: qty 1

## 2016-07-14 MED ORDER — CEFAZOLIN SODIUM-DEXTROSE 2-3 GM-% IV SOLR
2.0000 g | INTRAVENOUS | Status: DC
  Filled 2016-07-14: qty 50

## 2016-07-14 MED ORDER — SOD CITRATE-CITRIC ACID 500-334 MG/5ML PO SOLN
30.0000 mL | ORAL | Status: AC
  Administered 2016-07-14 – 2016-07-15 (×2): 30 mL via ORAL

## 2016-07-14 MED ORDER — OXYTOCIN 10 UNIT/ML IJ SOLN
INTRAMUSCULAR | Status: AC
  Filled 2016-07-14: qty 2

## 2016-07-14 MED ORDER — FENTANYL 2.5 MCG/ML W/ROPIVACAINE 0.2% IN NS 100 ML EPIDURAL INFUSION (ARMC-ANES)
EPIDURAL | Status: DC | PRN
  Administered 2016-07-14: 10 mL/h via EPIDURAL

## 2016-07-14 MED ORDER — OXYTOCIN 40 UNITS IN LACTATED RINGERS INFUSION - SIMPLE MED
1.0000 m[IU]/min | INTRAVENOUS | Status: DC
  Administered 2016-07-14: 1 m[IU]/min via INTRAVENOUS

## 2016-07-14 MED ORDER — SODIUM CHLORIDE 0.9 % IJ SOLN
INTRAMUSCULAR | Status: AC
  Filled 2016-07-14: qty 50

## 2016-07-14 MED ORDER — CEFAZOLIN SODIUM-DEXTROSE 2-4 GM/100ML-% IV SOLN: 2.0000 g | INTRAVENOUS | Status: DC

## 2016-07-14 MED ORDER — LIDOCAINE HCL (PF) 1 % IJ SOLN
INTRAMUSCULAR | Status: AC
  Filled 2016-07-14: qty 30

## 2016-07-14 MED ORDER — MISOPROSTOL 200 MCG PO TABS
ORAL_TABLET | ORAL | Status: AC
  Filled 2016-07-14: qty 4

## 2016-07-14 MED ORDER — BUPIVACAINE 0.25 % ON-Q PUMP DUAL CATH 400 ML
400.0000 mL | INJECTION | Status: DC
  Filled 2016-07-14: qty 400

## 2016-07-14 MED ORDER — AMMONIA AROMATIC IN INHA
RESPIRATORY_TRACT | Status: AC
  Filled 2016-07-14: qty 10

## 2016-07-14 SURGICAL SUPPLY — 28 items
BAG COUNTER SPONGE EZ (MISCELLANEOUS) ×2 IMPLANT
BAG SPNG 4X4 CLR HAZ (MISCELLANEOUS) ×1
CANISTER SUCT 3000ML (MISCELLANEOUS) ×2 IMPLANT
CATH KIT ON-Q SILVERSOAK 5 (CATHETERS) ×2 IMPLANT
CATH KIT ON-Q SILVERSOAK 5IN (CATHETERS) ×4 IMPLANT
CHLORAPREP W/TINT 26ML (MISCELLANEOUS) ×4 IMPLANT
DRSG OPSITE POSTOP 4X10 (GAUZE/BANDAGES/DRESSINGS) ×2 IMPLANT
DRSG TELFA 3X8 NADH (GAUZE/BANDAGES/DRESSINGS) ×2 IMPLANT
ELECT CAUTERY BLADE 6.4 (BLADE) ×2 IMPLANT
ELECT REM PT RETURN 9FT ADLT (ELECTROSURGICAL) ×2
ELECTRODE REM PT RTRN 9FT ADLT (ELECTROSURGICAL) ×1 IMPLANT
GAUZE SPONGE 4X4 12PLY STRL (GAUZE/BANDAGES/DRESSINGS) ×2 IMPLANT
GLOVE BIO SURGEON STRL SZ7 (GLOVE) ×2 IMPLANT
GLOVE INDICATOR 7.5 STRL GRN (GLOVE) ×2 IMPLANT
GOWN STRL REUS W/ TWL LRG LVL3 (GOWN DISPOSABLE) ×3 IMPLANT
GOWN STRL REUS W/TWL LRG LVL3 (GOWN DISPOSABLE) ×6
LIQUID BAND (GAUZE/BANDAGES/DRESSINGS) ×2 IMPLANT
NS IRRIG 1000ML POUR BTL (IV SOLUTION) ×2 IMPLANT
PACK C SECTION AR (MISCELLANEOUS) ×2 IMPLANT
PAD DRESSING TELFA 3X8 NADH (GAUZE/BANDAGES/DRESSINGS) ×1 IMPLANT
PAD OB MATERNITY 4.3X12.25 (PERSONAL CARE ITEMS) ×2 IMPLANT
PAD PREP 24X41 OB/GYN DISP (PERSONAL CARE ITEMS) ×2 IMPLANT
STRIP CLOSURE SKIN 1/2X4 (GAUZE/BANDAGES/DRESSINGS) ×2 IMPLANT
SUT MNCRL AB 4-0 PS2 18 (SUTURE) ×2 IMPLANT
SUT PDS AB 1 TP1 96 (SUTURE) ×4 IMPLANT
SUT VIC AB 0 CTX 36 (SUTURE) ×4
SUT VIC AB 0 CTX36XBRD ANBCTRL (SUTURE) ×2 IMPLANT
SUT VIC AB 2-0 CT1 36 (SUTURE) ×2 IMPLANT

## 2016-07-14 NOTE — Progress Notes (Signed)
Patient hypotension following epidural placement,Mom states she felt very nauseated and couldn't breath, mom also kept wanting to sit up. Several RNs at bedside continues to adjust [place] and assess for baby however rate mimics  maternal heart rate so may have been maternal.  [place]art, MDA and Satebler,MD at bedside. Neosyneprine given at bedside by Dr Ronelle Nigh. As we were getting ready to go back to the OR fetal heart rate and BP improved and C/S was canceled by Toniann Fail, MD

## 2016-07-14 NOTE — Progress Notes (Signed)
Subjective:  Comfortable  Objective:   Vitals: Blood pressure 119/75, pulse 96, temperature 97.6 F (36.4 C), temperature source Oral, resp. rate 18, height 5\' 3"  (1.6 m), weight 215 lb (97.5 kg), last menstrual period 10/20/2015, SpO2 99 %. General:  Abdomen: Cervical Exam:  Dilation: 5.5 Effacement (%): 80 Cervical Position: Posterior Station: -2 Presentation: Vertex Exam by:: Georgianne Fick, MD  FHT: 145, moderate, +accels, no decels Toco: q25min  Results for orders placed or performed during the hospital encounter of 07/13/16 (from the past 24 hour(s))  Type and screen     Status: None   Collection Time: 07/13/16 10:43 PM  Result Value Ref Range   ABO/RH(D) A POS    Antibody Screen NEG    Sample Expiration 07/16/2016   Protein / creatinine ratio, urine     Status: None   Collection Time: 07/14/16 12:00 AM  Result Value Ref Range   Creatinine, Urine 79 mg/dL   Total Protein, Urine 6 mg/dL   Protein Creatinine Ratio 0.08 0.00 - 0.15 mg/mg[Cre]  Glucose, capillary     Status: None   Collection Time: 07/14/16  9:20 AM  Result Value Ref Range   Glucose-Capillary 83 65 - 99 mg/dL  Glucose, capillary     Status: None   Collection Time: 07/14/16  1:06 PM  Result Value Ref Range   Glucose-Capillary 68 65 - 99 mg/dL  CBC     Status: Abnormal   Collection Time: 07/14/16  5:07 PM  Result Value Ref Range   WBC 9.6 3.6 - 11.0 K/uL   RBC 3.94 3.80 - 5.20 MIL/uL   Hemoglobin 12.5 12.0 - 16.0 g/dL   HCT 36.9 35.0 - 47.0 %   MCV 93.5 80.0 - 100.0 fL   MCH 31.7 26.0 - 34.0 pg   MCHC 33.9 32.0 - 36.0 g/dL   RDW 14.8 (H) 11.5 - 14.5 %   Platelets 376 150 - 440 K/uL  Glucose, capillary     Status: None   Collection Time: 07/14/16  5:21 PM  Result Value Ref Range   Glucose-Capillary 65 65 - 99 mg/dL  Glucose, capillary     Status: Abnormal   Collection Time: 07/14/16  8:28 PM  Result Value Ref Range   Glucose-Capillary 112 (H) 65 - 99 mg/dL    Assessment:   41 y.o. G2P1 [redacted]w[redacted]d  IOL GHTN, GDMA1, and AMA>40  Plan:   1) Labor - continue to titrate pitocin as indicated, making good cervical  2) Fetus - category I tracing

## 2016-07-14 NOTE — Progress Notes (Signed)
Called to bedside because patient felt short of breath after epidural, "heart rate lower"  Came to evalute heart rate in the 60's hypotensive and anesthesia at bedside not responding to ephedrine.  Proceed with stat section

## 2016-07-14 NOTE — Progress Notes (Signed)
Subjective:  Doing well, feeling some but not all contractions  Objective:   Vitals: Blood pressure 115/75, pulse 97, temperature 98.3 F (36.8 C), temperature source Oral, resp. rate 18, height 5\' 3"  (1.6 m), weight 215 lb (97.5 kg), last menstrual period 10/20/2015, SpO2 100 %. General:  Abdomen: Cervical Exam:  Dilation: 1 Effacement (%): 40 Cervical Position: Posterior Station: -3 Presentation: Vertex Exam by:: Buzz Axel,MD Cervical foley bulb placed  FHT: 150, moderate, +accels, no decels Toco: q85min  Results for orders placed or performed during the hospital encounter of 07/13/16 (from the past 24 hour(s))  CBC     Status: Abnormal   Collection Time: 07/13/16  9:14 PM  Result Value Ref Range   WBC 9.1 3.6 - 11.0 K/uL   RBC 3.94 3.80 - 5.20 MIL/uL   Hemoglobin 12.5 12.0 - 16.0 g/dL   HCT 36.3 35.0 - 47.0 %   MCV 92.2 80.0 - 100.0 fL   MCH 31.7 26.0 - 34.0 pg   MCHC 34.3 32.0 - 36.0 g/dL   RDW 14.8 (H) 11.5 - 14.5 %   Platelets 413 150 - 440 K/uL  Comprehensive metabolic panel     Status: Abnormal   Collection Time: 07/13/16  9:14 PM  Result Value Ref Range   Sodium 136 135 - 145 mmol/L   Potassium 3.8 3.5 - 5.1 mmol/L   Chloride 107 101 - 111 mmol/L   CO2 22 22 - 32 mmol/L   Glucose, Bld 100 (H) 65 - 99 mg/dL   BUN 9 6 - 20 mg/dL   Creatinine, Ser 0.80 0.44 - 1.00 mg/dL   Calcium 9.0 8.9 - 10.3 mg/dL   Total Protein 6.4 (L) 6.5 - 8.1 g/dL   Albumin 2.9 (L) 3.5 - 5.0 g/dL   AST 31 15 - 41 U/L   ALT 19 14 - 54 U/L   Alkaline Phosphatase 165 (H) 38 - 126 U/L   Total Bilirubin 0.6 0.3 - 1.2 mg/dL   GFR calc non Af Amer >60 >60 mL/min   GFR calc Af Amer >60 >60 mL/min   Anion gap 7 5 - 15  Rapid HIV screen (HIV 1/2 Ab+Ag) ([facility] Only)     Status: None   Collection Time: 07/13/16  9:14 PM  Result Value Ref Range   HIV-1 P24 Antigen - HIV24 NON REACTIVE NON REACTIVE   HIV 1/2 Antibodies NON REACTIVE NON REACTIVE   Interpretation (HIV Ag Ab)      A non reactive  test result means that HIV 1 or HIV 2 antibodies and HIV 1 p24 antigen were not detected in the specimen.  Type and screen     Status: None   Collection Time: 07/13/16 10:43 PM  Result Value Ref Range   ABO/RH(D) A POS    Antibody Screen NEG    Sample Expiration 07/16/2016   Protein / creatinine ratio, urine     Status: None   Collection Time: 07/14/16 12:00 AM  Result Value Ref Range   Creatinine, Urine 79 mg/dL   Total Protein, Urine 6 mg/dL   Protein Creatinine Ratio 0.08 0.00 - 0.15 mg/mg[Cre]  Glucose, capillary     Status: None   Collection Time: 07/14/16  9:20 AM  Result Value Ref Range   Glucose-Capillary 83 65 - 99 mg/dL    Assessment:   41 y.o. G2P1 [redacted]w[redacted]d IOL CHTN, GDMA1, AMA >43  Plan:   1) Labor - continue to titrate up pitocin.  Plan on epidural and AROM once  foley bulb removed  2) Fetus - cat I tracing  3) CHTN - continue to monitor BP, unmedicated and normotensive this pregnancy  4) GDM - start scheduled accuchecks in active phase

## 2016-07-14 NOTE — Progress Notes (Signed)
Subjective:  Feeling increasing strength of contractions Objective:   Vitals: Blood pressure 114/61, pulse (!) 103, temperature 98.3 F (36.8 C), temperature source Oral, resp. rate 18, height 5\' 3"  (1.6 m), weight 215 lb (97.5 kg), last menstrual period 10/20/2015, SpO2 100 %. General:  Abdomen: Cervical Exam:  Dilation: 1 Effacement (%): 40 Cervical Position: Posterior Exam by:: C. Sonbay, RN  FHT: 140, moderate, +accels, no decels Toco: q25min  Results for orders placed or performed during the hospital encounter of 07/13/16 (from the past 24 hour(s))  CBC     Status: Abnormal   Collection Time: 07/13/16  9:14 PM  Result Value Ref Range   WBC 9.1 3.6 - 11.0 K/uL   RBC 3.94 3.80 - 5.20 MIL/uL   Hemoglobin 12.5 12.0 - 16.0 g/dL   HCT 36.3 35.0 - 47.0 %   MCV 92.2 80.0 - 100.0 fL   MCH 31.7 26.0 - 34.0 pg   MCHC 34.3 32.0 - 36.0 g/dL   RDW 14.8 (H) 11.5 - 14.5 %   Platelets 413 150 - 440 K/uL  Comprehensive metabolic panel     Status: Abnormal   Collection Time: 07/13/16  9:14 PM  Result Value Ref Range   Sodium 136 135 - 145 mmol/L   Potassium 3.8 3.5 - 5.1 mmol/L   Chloride 107 101 - 111 mmol/L   CO2 22 22 - 32 mmol/L   Glucose, Bld 100 (H) 65 - 99 mg/dL   BUN 9 6 - 20 mg/dL   Creatinine, Ser 0.80 0.44 - 1.00 mg/dL   Calcium 9.0 8.9 - 10.3 mg/dL   Total Protein 6.4 (L) 6.5 - 8.1 g/dL   Albumin 2.9 (L) 3.5 - 5.0 g/dL   AST 31 15 - 41 U/L   ALT 19 14 - 54 U/L   Alkaline Phosphatase 165 (H) 38 - 126 U/L   Total Bilirubin 0.6 0.3 - 1.2 mg/dL   GFR calc non Af Amer >60 >60 mL/min   GFR calc Af Amer >60 >60 mL/min   Anion gap 7 5 - 15  Rapid HIV screen (HIV 1/2 Ab+Ag) ([facility] Only)     Status: None   Collection Time: 07/13/16  9:14 PM  Result Value Ref Range   HIV-1 P24 Antigen - HIV24 NON REACTIVE NON REACTIVE   HIV 1/2 Antibodies NON REACTIVE NON REACTIVE   Interpretation (HIV Ag Ab)      A non reactive test result means that HIV 1 or HIV 2 antibodies and HIV 1 p24  antigen were not detected in the specimen.  Type and screen     Status: None   Collection Time: 07/13/16 10:43 PM  Result Value Ref Range   ABO/RH(D) A POS    Antibody Screen NEG    Sample Expiration 07/16/2016   Protein / creatinine ratio, urine     Status: None   Collection Time: 07/14/16 12:00 AM  Result Value Ref Range   Creatinine, Urine 79 mg/dL   Total Protein, Urine 6 mg/dL   Protein Creatinine Ratio 0.08 0.00 - 0.15 mg/mg[Cre]  Glucose, capillary     Status: None   Collection Time: 07/14/16  9:20 AM  Result Value Ref Range   Glucose-Capillary 83 65 - 99 mg/dL    Assessment:   41 y.o. G2P1 [redacted]w[redacted]d IOL CHTN, AMA>40, and GDMA1  Plan:   1) Labor - once cervidil removed, patient may shower then start pitocin.  2) Fetus - cat I tracing  3) CHTN - has  been normotensive throughout pregnancy (actually antihypertensive agents discontinued).  Will continue to moniter intrapartum and postpartum  4) GDMA1 - accuchecks in active phase

## 2016-07-14 NOTE — Progress Notes (Signed)
Subjective:  Feels slight nauseated (took bicitra)  Otherwise comfortable.  Some slight numbness tingling right hand  Objective:   Vitals: Blood pressure 128/71, pulse (!) 101, temperature 98.3 F (36.8 C), temperature source Oral, resp. rate 18, height 5\' 3"  (1.6 m), weight 215 lb (97.5 kg), last menstrual period 10/20/2015, SpO2 98 %. General: NAD Abdomen: gravid, non-tender Cervical Exam: 4.5/50/-3 AROM was clear  FHT: 150, moderate, +accels, no decels Toco: q5-19min  Results for orders placed or performed during the hospital encounter of 07/13/16 (from the past 24 hour(s))  CBC     Status: Abnormal   Collection Time: 07/13/16  9:14 PM  Result Value Ref Range   WBC 9.1 3.6 - 11.0 K/uL   RBC 3.94 3.80 - 5.20 MIL/uL   Hemoglobin 12.5 12.0 - 16.0 g/dL   HCT 36.3 35.0 - 47.0 %   MCV 92.2 80.0 - 100.0 fL   MCH 31.7 26.0 - 34.0 pg   MCHC 34.3 32.0 - 36.0 g/dL   RDW 14.8 (H) 11.5 - 14.5 %   Platelets 413 150 - 440 K/uL  Comprehensive metabolic panel     Status: Abnormal   Collection Time: 07/13/16  9:14 PM  Result Value Ref Range   Sodium 136 135 - 145 mmol/L   Potassium 3.8 3.5 - 5.1 mmol/L   Chloride 107 101 - 111 mmol/L   CO2 22 22 - 32 mmol/L   Glucose, Bld 100 (H) 65 - 99 mg/dL   BUN 9 6 - 20 mg/dL   Creatinine, Ser 0.80 0.44 - 1.00 mg/dL   Calcium 9.0 8.9 - 10.3 mg/dL   Total Protein 6.4 (L) 6.5 - 8.1 g/dL   Albumin 2.9 (L) 3.5 - 5.0 g/dL   AST 31 15 - 41 U/L   ALT 19 14 - 54 U/L   Alkaline Phosphatase 165 (H) 38 - 126 U/L   Total Bilirubin 0.6 0.3 - 1.2 mg/dL   GFR calc non Af Amer >60 >60 mL/min   GFR calc Af Amer >60 >60 mL/min   Anion gap 7 5 - 15  Rapid HIV screen (HIV 1/2 Ab+Ag) ([facility] Only)     Status: None   Collection Time: 07/13/16  9:14 PM  Result Value Ref Range   HIV-1 P24 Antigen - HIV24 NON REACTIVE NON REACTIVE   HIV 1/2 Antibodies NON REACTIVE NON REACTIVE   Interpretation (HIV Ag Ab)      A non reactive test result means that HIV 1 or HIV 2  antibodies and HIV 1 p24 antigen were not detected in the specimen.  Type and screen     Status: None   Collection Time: 07/13/16 10:43 PM  Result Value Ref Range   ABO/RH(D) A POS    Antibody Screen NEG    Sample Expiration 07/16/2016   Protein / creatinine ratio, urine     Status: None   Collection Time: 07/14/16 12:00 AM  Result Value Ref Range   Creatinine, Urine 79 mg/dL   Total Protein, Urine 6 mg/dL   Protein Creatinine Ratio 0.08 0.00 - 0.15 mg/mg[Cre]  Glucose, capillary     Status: None   Collection Time: 07/14/16  9:20 AM  Result Value Ref Range   Glucose-Capillary 83 65 - 99 mg/dL  Glucose, capillary     Status: None   Collection Time: 07/14/16  1:06 PM  Result Value Ref Range   Glucose-Capillary 68 65 - 99 mg/dL  CBC     Status: Abnormal  Collection Time: 07/14/16  5:07 PM  Result Value Ref Range   WBC 9.6 3.6 - 11.0 K/uL   RBC 3.94 3.80 - 5.20 MIL/uL   Hemoglobin 12.5 12.0 - 16.0 g/dL   HCT 36.9 35.0 - 47.0 %   MCV 93.5 80.0 - 100.0 fL   MCH 31.7 26.0 - 34.0 pg   MCHC 33.9 32.0 - 36.0 g/dL   RDW 14.8 (H) 11.5 - 14.5 %   Platelets 376 150 - 440 K/uL  Glucose, capillary     Status: None   Collection Time: 07/14/16  5:21 PM  Result Value Ref Range   Glucose-Capillary 65 65 - 99 mg/dL    Assessment:   41 y.o. G2P1 [redacted]w[redacted]d IOL CHTN, GDMA1, AMA >40  Plan:   1) Labor - AROM, pitocin off for now while monitoring BP, may restart if remains stable to reinstate good contraction pattern  2) Fetus - fetal bradycardia with hypotension over 15 minutes following epidural placement, this did mimic maternal heart rate so may have been maternal.  However, given hypotension assumption was made that this did represent fetal heart rate.  Did not respond to initial interventions with ephedrine but as we were getting ready to go back to the OR fetal heart rate improved with improvement in BP.  Tracing prior to this had been category I.  Will monitor closely  3) GDMA1 start q2-hr  accucheck now that patient is 4.5cm

## 2016-07-14 NOTE — Anesthesia Procedure Notes (Signed)
Epidural Patient location during procedure: OB Start time: 07/14/2016 6:20 PM End time: 07/14/2016 6:41 PM  Staffing Anesthesiologist: Gunnar Fusi Resident/CRNA: Aline Brochure Performed: resident/CRNA   Preanesthetic Checklist Completed: patient identified, site marked, surgical consent, pre-op evaluation, IV checked, risks and benefits discussed and monitors and equipment checked  Epidural Patient position: sitting Prep: Betadine Patient monitoring: continuous pulse ox, heart rate and blood pressure Approach: midline Location: L3-L4 Injection technique: LOR air  Needle:  Needle type: Tuohy  Needle gauge: 17 G Needle length: 9 cm Needle insertion depth: 7 cm Catheter type: closed end flexible Catheter size: 19 Gauge Catheter at skin depth: 12 cm Test dose: negative and 1.5% lidocaine with Epi 1:200 K  Assessment Events: blood not aspirated, injection not painful, no injection resistance, negative IV test and no paresthesia  Additional Notes Reason for block:procedure for pain

## 2016-07-14 NOTE — Anesthesia Preprocedure Evaluation (Addendum)
Anesthesia Evaluation  Patient identified by MRN, date of birth, ID band Patient awake    Reviewed: Allergy & Precautions, H&P , NPO status , Patient's Chart, lab work & pertinent test results  History of Anesthesia Complications Negative for: history of anesthetic complications  Airway Mallampati: II       Dental no notable dental hx. (+) Teeth Intact   Pulmonary neg pulmonary ROS,           Cardiovascular hypertension,      Neuro/Psych    GI/Hepatic   Endo/Other  diabetes, Well Controlled, Gestational  Renal/GU      Musculoskeletal   Abdominal   Peds  Hematology   Anesthesia Other Findings   Reproductive/Obstetrics (+) Pregnancy                            Anesthesia Physical Anesthesia Plan  ASA: II  Anesthesia Plan: Epidural   Post-op Pain Management:    Induction:   Airway Management Planned:   Additional Equipment:   Intra-op Plan:   Post-operative Plan:   Informed Consent: I have reviewed the patients History and Physical, chart, labs and discussed the procedure including the risks, benefits and alternatives for the proposed anesthesia with the patient or authorized representative who has indicated his/her understanding and acceptance.     Plan Discussed with: Anesthesiologist  Anesthesia Plan Comments:         Anesthesia Quick Evaluation

## 2016-07-15 ENCOUNTER — Encounter: Payer: Self-pay | Admitting: *Deleted

## 2016-07-15 ENCOUNTER — Encounter
Admission: EM | Disposition: A | Discharge status: Home / Self Care | Payer: Self-pay | Source: Home / Self Care | Attending: Obstetrics and Gynecology

## 2016-07-15 LAB — CBC
HCT: 32.7 % — ABNORMAL LOW (ref 35.0–47.0)
HEMOGLOBIN: 10.9 g/dL — AB (ref 12.0–16.0)
MCH: 31.3 pg (ref 26.0–34.0)
MCHC: 33.2 g/dL (ref 32.0–36.0)
MCV: 94.2 fL (ref 80.0–100.0)
PLATELETS: 321 10*3/uL (ref 150–440)
RBC: 3.47 MIL/uL — AB (ref 3.80–5.20)
RDW: 14.6 % — ABNORMAL HIGH (ref 11.5–14.5)
WBC: 16.6 10*3/uL — AB (ref 3.6–11.0)

## 2016-07-15 LAB — RPR: RPR: NONREACTIVE

## 2016-07-15 SURGERY — Surgical Case
Anesthesia: Epidural | Site: Abdomen | Wound class: Clean Contaminated

## 2016-07-15 MED ORDER — CEFAZOLIN SODIUM-DEXTROSE 2-4 GM/100ML-% IV SOLN: 2.0000 g | INTRAVENOUS | Status: DC

## 2016-07-15 MED ORDER — SIMETHICONE 80 MG PO CHEW
80.0000 mg | CHEWABLE_TABLET | ORAL | Status: DC | PRN
  Administered 2016-07-17 (×4): 80 mg via ORAL
  Filled 2016-07-15 (×2): qty 1

## 2016-07-15 MED ORDER — NALBUPHINE HCL 10 MG/ML IJ SOLN
5.0000 mg | INTRAMUSCULAR | Status: DC | PRN
  Administered 2016-07-15: 5 mg via INTRAVENOUS
  Filled 2016-07-15: qty 1

## 2016-07-15 MED ORDER — OXYTOCIN 10 UNIT/ML IJ SOLN
INTRAMUSCULAR | Status: AC
  Filled 2016-07-15: qty 4

## 2016-07-15 MED ORDER — FENTANYL CITRATE (PF) 100 MCG/2ML IJ SOLN
INTRAMUSCULAR | Status: AC
  Administered 2016-07-15: 25 ug via INTRAVENOUS
  Filled 2016-07-15: qty 2

## 2016-07-15 MED ORDER — IBUPROFEN 600 MG PO TABS
600.0000 mg | ORAL_TABLET | Freq: Four times a day (QID) | ORAL | Status: DC
  Administered 2016-07-16 – 2016-07-18 (×9): 600 mg via ORAL
  Filled 2016-07-15 (×4): qty 1

## 2016-07-15 MED ORDER — SENNOSIDES-DOCUSATE SODIUM 8.6-50 MG PO TABS
2.0000 | ORAL_TABLET | ORAL | Status: DC
  Administered 2016-07-16 – 2016-07-18 (×3): 2 via ORAL
  Filled 2016-07-15 (×3): qty 2

## 2016-07-15 MED ORDER — NALBUPHINE HCL 10 MG/ML IJ SOLN: 5.0000 mg | Freq: Once | INTRAMUSCULAR | Status: DC | PRN

## 2016-07-15 MED ORDER — FENTANYL CITRATE (PF) 100 MCG/2ML IJ SOLN
25.0000 ug | INTRAMUSCULAR | Status: DC | PRN
  Administered 2016-07-15 (×3): 25 ug via INTRAVENOUS

## 2016-07-15 MED ORDER — PRENATAL MULTIVITAMIN CH
1.0000 | ORAL_TABLET | Freq: Every day | ORAL | Status: DC
  Administered 2016-07-15 – 2016-07-17 (×3): 1 via ORAL
  Filled 2016-07-15 (×3): qty 1

## 2016-07-15 MED ORDER — NALBUPHINE HCL 10 MG/ML IJ SOLN: 5.0000 mg | INTRAMUSCULAR | Status: DC | PRN

## 2016-07-15 MED ORDER — SODIUM CHLORIDE 0.9% FLUSH: 3.0000 mL | INTRAVENOUS | Status: DC | PRN

## 2016-07-15 MED ORDER — OXYTOCIN 40 UNITS IN LACTATED RINGERS INFUSION - SIMPLE MED
2.5000 [IU]/h | INTRAVENOUS | Status: AC
  Filled 2016-07-15: qty 1000

## 2016-07-15 MED ORDER — KETOROLAC TROMETHAMINE 30 MG/ML IJ SOLN
30.0000 mg | Freq: Four times a day (QID) | INTRAMUSCULAR | Status: AC | PRN
  Administered 2016-07-15 – 2016-07-16 (×4): 30 mg via INTRAVENOUS
  Filled 2016-07-15 (×4): qty 1

## 2016-07-15 MED ORDER — IBUPROFEN 600 MG PO TABS
600.0000 mg | ORAL_TABLET | Freq: Four times a day (QID) | ORAL | Status: DC | PRN
  Administered 2016-07-15: 600 mg via ORAL
  Filled 2016-07-15 (×6): qty 1

## 2016-07-15 MED ORDER — SIMETHICONE 80 MG PO CHEW
80.0000 mg | CHEWABLE_TABLET | Freq: Three times a day (TID) | ORAL | Status: DC
  Administered 2016-07-15 – 2016-07-18 (×9): 80 mg via ORAL
  Filled 2016-07-15 (×13): qty 1

## 2016-07-15 MED ORDER — OXYTOCIN 40 UNITS IN LACTATED RINGERS INFUSION - SIMPLE MED
INTRAVENOUS | Status: DC | PRN
  Administered 2016-07-15: 1000 mL via INTRAVENOUS

## 2016-07-15 MED ORDER — PHENYLEPHRINE HCL 10 MG/ML IJ SOLN
INTRAMUSCULAR | Status: DC | PRN
  Administered 2016-07-15 (×3): 100 ug via INTRAVENOUS
  Administered 2016-07-15: 200 ug via INTRAVENOUS
  Administered 2016-07-15 (×4): 100 ug via INTRAVENOUS

## 2016-07-15 MED ORDER — MORPHINE SULFATE (PF) 2 MG/ML IV SOLN
1.0000 mg | Freq: Once | INTRAVENOUS | Status: AC
  Administered 2016-07-15: 1 mg via INTRAVENOUS
  Filled 2016-07-15: qty 1

## 2016-07-15 MED ORDER — MORPHINE SULFATE (PF) 0.5 MG/ML IJ SOLN
INTRAMUSCULAR | Status: DC | PRN
  Administered 2016-07-15: 2 mg via EPIDURAL

## 2016-07-15 MED ORDER — SOD CITRATE-CITRIC ACID 500-334 MG/5ML PO SOLN: 30.0000 mL | ORAL | Status: DC

## 2016-07-15 MED ORDER — MORPHINE SULFATE (PF) 0.5 MG/ML IJ SOLN
INTRAMUSCULAR | Status: AC
  Filled 2016-07-15: qty 10

## 2016-07-15 MED ORDER — ONDANSETRON HCL 4 MG/2ML IJ SOLN
INTRAMUSCULAR | Status: DC | PRN
  Administered 2016-07-15: 4 mg via INTRAVENOUS

## 2016-07-15 MED ORDER — CEFAZOLIN SODIUM-DEXTROSE 2-3 GM-% IV SOLR
2.0000 g | INTRAVENOUS | Status: DC
  Filled 2016-07-15: qty 50

## 2016-07-15 MED ORDER — DIPHENHYDRAMINE HCL 25 MG PO CAPS
25.0000 mg | ORAL_CAPSULE | ORAL | Status: DC | PRN
  Administered 2016-07-15: 25 mg via ORAL
  Filled 2016-07-15: qty 1

## 2016-07-15 MED ORDER — DIBUCAINE 1 % RE OINT: 1.0000 "application " | TOPICAL_OINTMENT | RECTAL | Status: DC | PRN

## 2016-07-15 MED ORDER — ACETAMINOPHEN 325 MG PO TABS: 650.0000 mg | ORAL_TABLET | ORAL | Status: DC | PRN

## 2016-07-15 MED ORDER — DEXTROSE 5 % IV SOLN
1.0000 ug/kg/h | INTRAVENOUS | Status: DC | PRN
  Filled 2016-07-15: qty 2

## 2016-07-15 MED ORDER — SCOPOLAMINE 1 MG/3DAYS TD PT72: 1.0000 | MEDICATED_PATCH | Freq: Once | TRANSDERMAL | Status: DC

## 2016-07-15 MED ORDER — BUPIVACAINE-EPINEPHRINE 0.5% -1:200000 IJ SOLN: 20.0000 mL | Freq: Once | INTRAMUSCULAR | Status: DC

## 2016-07-15 MED ORDER — LACTATED RINGERS IV SOLN
INTRAVENOUS | Status: DC
  Administered 2016-07-15: 15:00:00 via INTRAVENOUS

## 2016-07-15 MED ORDER — DIPHENHYDRAMINE HCL 50 MG/ML IJ SOLN: 12.5000 mg | INTRAMUSCULAR | Status: DC | PRN

## 2016-07-15 MED ORDER — BUPIVACAINE HCL (PF) 0.5 % IJ SOLN
INTRAMUSCULAR | Status: DC | PRN
  Administered 2016-07-15: 10 mL

## 2016-07-15 MED ORDER — ONDANSETRON HCL 4 MG/2ML IJ SOLN: 4.0000 mg | Freq: Three times a day (TID) | INTRAMUSCULAR | Status: DC | PRN

## 2016-07-15 MED ORDER — DIPHENHYDRAMINE HCL 25 MG PO CAPS: 25.0000 mg | ORAL_CAPSULE | Freq: Four times a day (QID) | ORAL | Status: DC | PRN

## 2016-07-15 MED ORDER — OXYCODONE-ACETAMINOPHEN 5-325 MG PO TABS
1.0000 | ORAL_TABLET | ORAL | Status: DC | PRN
  Administered 2016-07-16 – 2016-07-18 (×3): 1 via ORAL
  Filled 2016-07-15 (×3): qty 1

## 2016-07-15 MED ORDER — COCONUT OIL OIL
1.0000 "application " | TOPICAL_OIL | Status: DC | PRN
  Administered 2016-07-17: 1 via TOPICAL
  Filled 2016-07-15 (×2): qty 120

## 2016-07-15 MED ORDER — BUPIVACAINE HCL (PF) 0.5 % IJ SOLN
INTRAMUSCULAR | Status: AC
  Filled 2016-07-15: qty 30

## 2016-07-15 MED ORDER — NALOXONE HCL 0.4 MG/ML IJ SOLN: 0.4000 mg | INTRAMUSCULAR | Status: DC | PRN

## 2016-07-15 MED ORDER — BUPIVACAINE 0.25 % ON-Q PUMP DUAL CATH 400 ML
400.0000 mL | INJECTION | Status: DC
  Filled 2016-07-15 (×2): qty 400

## 2016-07-15 MED ORDER — SOD CITRATE-CITRIC ACID 500-334 MG/5ML PO SOLN
ORAL | Status: AC
Start: 2016-07-15 — End: 2016-07-15
  Administered 2016-07-15: 30 mL via ORAL
  Filled 2016-07-15: qty 15

## 2016-07-15 MED ORDER — ONDANSETRON HCL 4 MG/2ML IJ SOLN: 4.0000 mg | Freq: Once | INTRAMUSCULAR | Status: DC | PRN

## 2016-07-15 MED ORDER — OXYCODONE-ACETAMINOPHEN 5-325 MG PO TABS
2.0000 | ORAL_TABLET | ORAL | Status: DC | PRN
  Administered 2016-07-15 – 2016-07-18 (×7): 2 via ORAL
  Filled 2016-07-15 (×8): qty 2

## 2016-07-15 MED ORDER — LIDOCAINE 2% (20 MG/ML) 5 ML SYRINGE
INTRAMUSCULAR | Status: DC | PRN
  Administered 2016-07-15 (×3): 100 mg via INTRAVENOUS

## 2016-07-15 MED ORDER — SIMETHICONE 80 MG PO CHEW
80.0000 mg | CHEWABLE_TABLET | ORAL | Status: DC
  Administered 2016-07-18: 80 mg via ORAL

## 2016-07-15 MED ORDER — DEXTROSE 5 % IV SOLN
500.0000 mg | INTRAVENOUS | Status: AC
  Administered 2016-07-15: 500 mg via INTRAVENOUS
  Filled 2016-07-15: qty 500

## 2016-07-15 MED ORDER — MENTHOL 3 MG MT LOZG
1.0000 | LOZENGE | OROMUCOSAL | Status: DC | PRN
  Filled 2016-07-15: qty 9

## 2016-07-15 MED ORDER — WITCH HAZEL-GLYCERIN EX PADS: 1.0000 "application " | MEDICATED_PAD | CUTANEOUS | Status: DC | PRN

## 2016-07-15 SURGICAL SUPPLY — 37 items
ADH SKN CLS APL DERMABOND .7 (GAUZE/BANDAGES/DRESSINGS) ×2
BAG COUNTER SPONGE EZ (MISCELLANEOUS) ×4 IMPLANT
BAG SPNG 4X4 CLR HAZ (MISCELLANEOUS) ×3
CANISTER SUCT 3000ML (MISCELLANEOUS) ×3 IMPLANT
CATH KIT ON-Q SILVERSOAK 5 (CATHETERS) ×2 IMPLANT
CATH KIT ON-Q SILVERSOAK 5IN (CATHETERS) ×6 IMPLANT
CHLORAPREP W/TINT 26ML (MISCELLANEOUS) ×6 IMPLANT
CLOSURE WOUND 1/2 X4 (GAUZE/BANDAGES/DRESSINGS)
COUNTER SPONGE BAG EZ (MISCELLANEOUS) ×3
DERMABOND ADVANCED (GAUZE/BANDAGES/DRESSINGS) ×4
DERMABOND ADVANCED .7 DNX12 (GAUZE/BANDAGES/DRESSINGS) IMPLANT
DRSG OPSITE POSTOP 4X10 (GAUZE/BANDAGES/DRESSINGS) ×3 IMPLANT
DRSG TELFA 3X8 NADH (GAUZE/BANDAGES/DRESSINGS) IMPLANT
ELECT CAUTERY BLADE 6.4 (BLADE) ×3 IMPLANT
ELECT REM PT RETURN 9FT ADLT (ELECTROSURGICAL) ×3
ELECTRODE REM PT RTRN 9FT ADLT (ELECTROSURGICAL) ×1 IMPLANT
GAUZE SPONGE 4X4 12PLY STRL (GAUZE/BANDAGES/DRESSINGS) ×1 IMPLANT
GLOVE BIO SURGEON STRL SZ7 (GLOVE) ×7 IMPLANT
GLOVE INDICATOR 7.5 STRL GRN (GLOVE) ×7 IMPLANT
GOWN STRL REUS W/ TWL LRG LVL3 (GOWN DISPOSABLE) ×3 IMPLANT
GOWN STRL REUS W/TWL LRG LVL3 (GOWN DISPOSABLE) ×9
LIQUID BAND (GAUZE/BANDAGES/DRESSINGS) ×1 IMPLANT
NS IRRIG 1000ML POUR BTL (IV SOLUTION) ×3 IMPLANT
PACK C SECTION AR (MISCELLANEOUS) ×3 IMPLANT
PAD DRESSING TELFA 3X8 NADH (GAUZE/BANDAGES/DRESSINGS) ×1 IMPLANT
PAD OB MATERNITY 4.3X12.25 (PERSONAL CARE ITEMS) ×3 IMPLANT
PAD PREP 24X41 OB/GYN DISP (PERSONAL CARE ITEMS) ×3 IMPLANT
RETRACTOR TRAXI PANNICULUS (MISCELLANEOUS) IMPLANT
SPONGE LAP 18X18 5 PK (GAUZE/BANDAGES/DRESSINGS) ×4 IMPLANT
STAPLER INSORB 30 2030 C-SECTI (MISCELLANEOUS) ×2 IMPLANT
STRIP CLOSURE SKIN 1/2X4 (GAUZE/BANDAGES/DRESSINGS) ×1 IMPLANT
SUT MNCRL AB 4-0 PS2 18 (SUTURE) ×1 IMPLANT
SUT PDS AB 1 TP1 96 (SUTURE) ×4 IMPLANT
SUT VIC AB 0 CTX 36 (SUTURE) ×6
SUT VIC AB 0 CTX36XBRD ANBCTRL (SUTURE) ×2 IMPLANT
SUT VIC AB 2-0 CT1 36 (SUTURE) ×5 IMPLANT
TRAXI PANNICULUS RETRACTOR (MISCELLANEOUS) ×2

## 2016-07-15 NOTE — Progress Notes (Signed)
Verbal communicated with Dr.Jackson regarding pts Blood pressures 140s over 80s.  Per MD continue to observe notify if BP continues to trend upward.

## 2016-07-15 NOTE — Discharge Summary (Signed)
[facility] Discharge Summary     Patient Name: Rebekah Brady DOB: 01-Dec-1975 MRN: [id number]  Date of admission: 07/13/2016 Delivering MD: Malachy Mood   Date of discharge: 07/18/2016  Admitting diagnosis: 19 wks preg induction x fetal bradycardia Intrauterine pregnancy: [redacted]w[redacted]d     Secondary diagnosis:  Principal Problem:   Hypertension affecting pregnancy in third trimester Active Problems:   HTN (hypertension)   [redacted] weeks gestation of pregnancy   Chronic hypertension in obstetric context, third trimester   Postpartum care following cesarean delivery  Additional problems: Fetal intolerance to labor requiring emergent C-section     Discharge diagnosis: Term Pregnancy Delivered, GDM A1 and AMA >40, chronic hypertension                                                                                                Post partum procedures:none  Augmentation: AROM, Pitocin, Foley Balloon and cervdil  Complications: None  Hospital course:  Induction of Labor With Cesarean Section  41 y.o. yo G2P1002 at [redacted]w[redacted]d was admitted to the hospital 07/13/2016 for induction of labor. Patient had a labor course significant for fetal intolerance to labor. The patient went for cesarean section due to Non-Reassuring FHR, and delivered a Viable infant,@BABYSUPPRESS (DBLINK,ept,110,,1,,) Membrane Rupture Time/Date: )7:18 PM ,07/14/2016   @Details  of operation can be found in separate operative Note.  Patient had an uncomplicated postpartum course. She is ambulating, tolerating a regular diet, passing flatus, and urinating well.  Patient is discharged home in stable condition on 07/18/16.                                    Physical exam  Vitals:   07/17/16 1939 07/18/16 0009 07/18/16 0324 07/18/16 0700  BP: (!) 147/87 (!) 145/87 133/81 129/71  Pulse: 88 (!) 102 99 96  Resp: 17 19 18 20   Temp: 99.5 F (37.5 C) 98.1 F (36.7 C) 99.2 F (37.3 C) 98.6 F (37 C)  TempSrc: Oral Oral Oral Oral  SpO2: 100%  100% 99%   Weight:      Height:       General: alert, cooperative and no distress Lochia: appropriate Uterine Fundus: firm Incision: Healing well with no significant drainage DVT Evaluation: No evidence of DVT seen on physical exam. Labs: Lab Results  Component Value Date   WBC 14.3 (H) 07/17/2016   HGB 10.5 (L) 07/17/2016   HCT 31.9 (L) 07/17/2016   MCV 94.4 07/17/2016   PLT 363 07/17/2016   CMP Latest Ref Rng & Units 07/17/2016  Glucose 65 - 99 mg/dL 83  BUN 6 - 20 mg/dL 11  Creatinine 0.44 - 1.00 mg/dL 0.71  Sodium 135 - 145 mmol/L 137  Potassium 3.5 - 5.1 mmol/L 3.6  Chloride 101 - 111 mmol/L 107  CO2 22 - 32 mmol/L 25  Calcium 8.9 - 10.3 mg/dL 8.3(L)  Total Protein 6.5 - 8.1 g/dL 5.7(L)  Total Bilirubin 0.3 - 1.2 mg/dL 0.4  Alkaline Phos 38 - 126 U/L 181(H)  AST 15 -  41 U/L 46(H)  ALT 14 - 54 U/L 26   Prenatal labs: A+, Rubella Immune, Varicella Immune, TDAP UTD Discharge instruction: per After Visit Summary and "Baby and Me Booklet".  After visit meds:  Allergies as of 07/18/2016   No Known Allergies     Medication List    STOP taking these medications   diltiazem 240 MG 24 hr capsule Commonly known as:  CARDIZEM CD   hydrochlorothiazide 25 MG tablet Commonly known as:  HYDRODIURIL   MIRENA IU     TAKE these medications   ACZONE 5 % topical gel Generic drug:  Dapsone Apply topically daily.   NIFEdipine 30 MG 24 hr tablet Commonly known as:  PROCARDIA-XL/ADALAT CC Take 1 tablet (30 mg total) by mouth daily. Start taking on:  07/19/2016   oxyCODONE-acetaminophen 5-325 MG tablet Commonly known as:  PERCOCET/ROXICET Take 1 tablet by mouth every 4 (four) hours as needed (pain scale 4-7).   tretinoin 0.05 % cream Commonly known as:  RETIN-A Apply topically at bedtime.       Diet: routine diet  Activity: Advance as tolerated. Pelvic rest for 6 weeks.   Outpatient follow up:  Follow up Appt: go to 1 week appointment with Dr Georgianne Fick for BP and  incision check   Postpartum contraception: considering vasectomy  Newborn Data: Live born female  Birth Weight: 6 lb 7 oz (2920 g) APGAR: 6, 8  Baby Feeding: Bottle and Breast Disposition:home with mother   Signed Rod Can, CNM

## 2016-07-15 NOTE — Progress Notes (Signed)
Ronelle Nigh, MD notified of increasing back pain, check level and increase epidural rate from 3ml/hr to 60ml/hr

## 2016-07-15 NOTE — Transfer of Care (Signed)
Immediate Anesthesia Transfer of Care Note  Patient: Rebekah Brady  Procedure(s) Performed: Procedure(s): CESAREAN SECTION (N/A)  Patient Location: Mother/Baby  Anesthesia Type:Epidural  Level of Consciousness: awake, alert  and oriented  Airway & Oxygen Therapy: Patient Spontanous Breathing  Post-op Assessment: Post -op Vital signs reviewed and stable  Post vital signs: stable  Last Vitals:  Vitals:   07/15/16 0311 07/15/16 0317  BP:  117/68  Pulse:  (!) 117  Resp:  17  Temp: 36.4 C 36.9 C    Last Pain:  Vitals:   07/15/16 0317  TempSrc: Oral  PainSc:          Complications: No apparent anesthesia complications

## 2016-07-15 NOTE — Progress Notes (Signed)
Subjective:  Called because patient complete  Objective:   Vitals: Blood pressure 122/60, pulse (!) 102, temperature 97.4 F (36.3 C), temperature source Oral, resp. rate 16, height 5\' 3"  (1.6 m), weight 215 lb (97.5 kg), last menstrual period 10/20/2015, SpO2 100 %. General: NAD Abdomen: gravid, non-tender Cervical Exam:  Dilation: 10 Dilation Complete Date: 07/15/16 Dilation Complete Time: 0113 Effacement (%): 100 Cervical Position: Posterior Station: -1 Presentation: Vertex Exam by:: McSween, RN LOP able to turn to ROA, there was no fetal movement with meternal pushing efforts station 0 to -1 and to high for trial of forceps  FHT: 160, moderate, no accels, decelerations FSE placed to further characterize and FHT noted to be ini the 60's recovered with tachycardia into the 160's followed by a deep variable.   Toco: q32min prior to terbutaline  Results for orders placed or performed during the hospital encounter of 07/13/16 (from the past 24 hour(s))  Glucose, capillary     Status: None   Collection Time: 07/14/16  9:20 AM  Result Value Ref Range   Glucose-Capillary 83 65 - 99 mg/dL  Glucose, capillary     Status: None   Collection Time: 07/14/16  1:06 PM  Result Value Ref Range   Glucose-Capillary 68 65 - 99 mg/dL  CBC     Status: Abnormal   Collection Time: 07/14/16  5:07 PM  Result Value Ref Range   WBC 9.6 3.6 - 11.0 K/uL   RBC 3.94 3.80 - 5.20 MIL/uL   Hemoglobin 12.5 12.0 - 16.0 g/dL   HCT 36.9 35.0 - 47.0 %   MCV 93.5 80.0 - 100.0 fL   MCH 31.7 26.0 - 34.0 pg   MCHC 33.9 32.0 - 36.0 g/dL   RDW 14.8 (H) 11.5 - 14.5 %   Platelets 376 150 - 440 K/uL  Glucose, capillary     Status: None   Collection Time: 07/14/16  5:21 PM  Result Value Ref Range   Glucose-Capillary 65 65 - 99 mg/dL  Glucose, capillary     Status: Abnormal   Collection Time: 07/14/16  8:28 PM  Result Value Ref Range   Glucose-Capillary 112 (H) 65 - 99 mg/dL  Glucose, capillary     Status:  Abnormal   Collection Time: 07/14/16 10:40 PM  Result Value Ref Range   Glucose-Capillary 107 (H) 65 - 99 mg/dL    Assessment:   41 y.o. G2P1 [redacted]w[redacted]d AMA, GDMA1, CHTN  Plan:   1) Labor - complete but displaying signs of fetal intolerance to labor  2) Fetus - category II tracing but remote from delivery and concern for late decelerations.  The patient was counseled regarding risk and benefits to proceeding with Cesarean section to expedite delivery.  Risk of cesarean section were discussed including risk of bleeding and need for potential intraoperative or postoperative blood transfusion with a rate of approximately 5% quoted for all Cesarean sections, risk of injury to adjacent organs including but not limited to bowl and bladder, the need for additional surgical procedures to address such injuries, and the risk of infection.  The risk of continued attempts at vaginal delivery include but are note limited to worsening fetal or maternal status.  After consideration of options the patient is amenable to proceed with primary cesarean section for delivery.

## 2016-07-15 NOTE — Anesthesia Post-op Follow-up Note (Cosign Needed)
Anesthesia QCDR form completed.        

## 2016-07-15 NOTE — Op Note (Signed)
Preoperative Diagnosis: 1) 41 y.o. G2P1001 at [redacted]w[redacted]d 2) Fetal intolerance to labor 3) Chronic hypertension 4) Gestational diabetes diet controled 5) Advance maternal age  Postoperative Diagnosis: 1) 41 y.o. G2P2002 at [redacted]w[redacted]d 2) Fetal intolerance to labor 3) Chronic hypertension 4) Gestational diabetes diet controled 5) Advance maternal age  Operation Performed: Primary low transverse C-section via pfannenstiel skin incision  Indication: Fetal intolerance to labor  Anesthesia: Epidural  Primary Surgeon: Malachy Mood, MD  Assistant: Pat Patrick, surgical tech  Preoperative Antibiotics: 2g ancef and 527mL azithromycin  Estimated Blood Loss: 832mL  IV Fluids: 1279mL  Urine Output:: 8mL  Drains or Tubes: Foley to gravity drainage, ON-Q catheter system  Implants: none  Specimens Removed: none  Complications: none  Intraoperative Findings:  Normal tubes ovaries and uterus.  Accessory lobe of placenta.  Delivery resulted in the birth of a liveborn female APGAR (1 MIN): 6   APGAR (5 MINS): 8   Weight: 6lbs 7oz  Patient Condition: stable  Procedure in Detail:  Patient was taken to the operating room were she was administered regional anesthesia.  She was positioned in the supine position, prepped and draped in the  Usual sterile fashion.  Prior to proceeding with the case a time out was performed and the level of anesthetic was checked and noted to be adequate.  Utilizing the scalpel a pfannenstiel skin incision was made 2cm above the pubic symphysis and carried down sharply to the the level of the rectus fascia.  The fascia was incised in the midline using the scalpel and then extended using mayo scissors.  The superior border of the rectus fascia was grasped with two Kocher clamps and the underlying rectus muscles were dissected of the fascia using blunt dissection.  The median raphae was incised using Mayo scissors.   The inferior border of the rectus fascia was  dissected of the rectus muscles in a similar fashion.  The midline was identified, the peritoneum was entered bluntly and expanded using manual tractions.  The uterus was noted to be in a none rotated position.  Next the bladder blade was placed retracting the bladder caudally.  A bladder flap was created.  A low transverse incision was scored on the lower uterine segment.  The hysterotomy was entered bluntly using the operators finger.  The hysterotomy incision was extended using manual traction.  The operators hand was placed within the hysterotomy position noting the fetus to be within the OA position.  The vertex was grasped, flexed, brought to the incision, and delivered a traumatically using fundal pressure.  The remainder of the body delivered with ease.  The infant was suctioned, cord was clamped and cut before handing off to the awaiting neonatologist.  The placenta was delivered using manual extraction.  The uterus was exteriorized, wiped clean of clots and debris using two moist laps.  The hysterotomy was closed using a two layer closure of 0 Vicryl, with the first being a running locked, the second a vertical imbricating.  The uterus was returned to the abdomen.  The peritoneal gutters were wiped clean of clots and debris using two moist laps.  The hysterotomy incision was re-inspected noted to be hemostatic.  The rectus muscles were re-approximated in the midline using a single 2-0 Vicryl mattress stitch.  The rectus muscles were inspected noted to be hemostatic.  The superior border of the rectus fascia was grasped with a Kocher clamp.  The ON-Q trocars were then placed 4cm above the superior border of the incision  and tunneled subfascially.  The introducers were removed and the catheters were threaded through the sleeves after which the sleeves were removed.  The fascia was closed using a looped #1 PDS in a running fashion taking 1cm by 1cm bites.  The subcutaneous tissue was irrigated using warm  saline, hemostasis achieved using the bovie.  The subcutaneous dead space was less than 3cm and was not closed.  The skin was closed using Insorb staples.  Sponge needle and instrument counts were corrects times two.  The patient tolerated the procedure well and was taken to the recovery room in stable condition.

## 2016-07-16 LAB — CHLAMYDIA/NGC RT PCR (ARMC ONLY)
CHLAMYDIA TR: NOT DETECTED
N GONORRHOEAE: NOT DETECTED

## 2016-07-16 NOTE — Progress Notes (Signed)
Patient wanted to try getting up and walking. I suggested we take foley out so that she could walk to bathroom. Explained the importance of walking/being out of bed after surgery and the risk of infection with continued use of foley catheter. Patient wanted to try getting up to see how it feels first. After getting up and walking to and from bathroom, patient decided to keep foley for the night so she could get some rest. Agreed to take foley out first thing in the morning before day shift. Peri-care and foley-care done.  Jonne Ply, RN

## 2016-07-16 NOTE — Anesthesia Post-op Follow-up Note (Signed)
  Anesthesia Pain Follow-up Note  Patient: Rebekah Brady  Day #: 1  Date of Follow-up: 07/16/2016 Time: 7:26 AM  Last Vitals:  Vitals:   07/16/16 0059 07/16/16 0330  BP: 128/80 130/74  Pulse: 93 87  Resp: 18 18  Temp: 36.7 C 36.9 C    Level of Consciousness: alert  Pain: mild   Side Effects:None  Catheter Site Exam:clean, dry     Plan: D/C from anesthesia care at surgeon's request  Estill Batten

## 2016-07-16 NOTE — Anesthesia Postprocedure Evaluation (Signed)
Anesthesia Post Note  Patient: Rebekah Brady  Procedure(s) Performed: Procedure(s) (LRB): CESAREAN SECTION (N/A)  Patient location during evaluation: Mother Baby Anesthesia Type: Epidural Level of consciousness: awake and alert and oriented Pain management: pain level controlled Vital Signs Assessment: post-procedure vital signs reviewed and stable Respiratory status: spontaneous breathing Cardiovascular status: blood pressure returned to baseline Postop Assessment: no headache, no signs of nausea or vomiting and adequate PO intake Anesthetic complications: no     Last Vitals:  Vitals:   07/16/16 0059 07/16/16 0330  BP: 128/80 130/74  Pulse: 93 87  Resp: 18 18  Temp: 36.7 C 36.9 C    Last Pain:  Vitals:   07/16/16 0605  TempSrc:   PainSc: 4                  Tennessee Hanlon,  Clearnce Sorrel

## 2016-07-16 NOTE — Progress Notes (Signed)
Admit Date: 07/13/2016 Today's Date: 07/16/2016  Subjective: Postpartum Day 1: Cesarean Delivery Patient reports tolerating PO, + flatus and no problems voiding.    Objective: Vital signs in last 24 hours: Temp:  [97.6 F (36.4 C)-98.5 F (36.9 C)] 98.5 F (36.9 C) (03/03 1123) Pulse Rate:  [85-93] 86 (03/03 0807) Resp:  [16-20] 17 (03/03 0807) BP: (108-143)/(62-82) 122/73 (03/03 0807) SpO2:  [98 %-100 %] 98 % (03/03 0807)  Physical Exam:  General: alert, cooperative and no distress Lochia: appropriate Uterine Fundus: firm Incision: healing well DVT Evaluation: No evidence of DVT seen on physical exam. Negative Homan's sign.   Recent Labs  07/14/16 1707 07/15/16 0726  HGB 12.5 10.9*  HCT 36.9 32.7*    Assessment/Plan: Status post Cesarean section. Doing well postoperatively.  Continue current care. BS WNL. BP under good control (no meds needed) Infant doing well, breast feeding   Hoyt Koch 07/16/2016, 12:04 PM

## 2016-07-17 LAB — PROTEIN / CREATININE RATIO, URINE
CREATININE, URINE: 73 mg/dL
PROTEIN CREATININE RATIO: 0.18 mg/mg{creat} — AB (ref 0.00–0.15)
Total Protein, Urine: 13 mg/dL

## 2016-07-17 LAB — CBC WITH DIFFERENTIAL/PLATELET
BASOS ABS: 0.1 10*3/uL (ref 0–0.1)
Basophils Relative: 1 %
EOS PCT: 3 %
Eosinophils Absolute: 0.4 10*3/uL (ref 0–0.7)
HCT: 31.9 % — ABNORMAL LOW (ref 35.0–47.0)
Hemoglobin: 10.5 g/dL — ABNORMAL LOW (ref 12.0–16.0)
LYMPHS ABS: 1.6 10*3/uL (ref 1.0–3.6)
LYMPHS PCT: 11 %
MCH: 31.2 pg (ref 26.0–34.0)
MCHC: 33 g/dL (ref 32.0–36.0)
MCV: 94.4 fL (ref 80.0–100.0)
MONO ABS: 1.3 10*3/uL — AB (ref 0.2–0.9)
Monocytes Relative: 9 %
NEUTROS ABS: 10.9 10*3/uL — AB (ref 1.4–6.5)
Neutrophils Relative %: 76 %
PLATELETS: 363 10*3/uL (ref 150–440)
RBC: 3.38 MIL/uL — ABNORMAL LOW (ref 3.80–5.20)
RDW: 15.2 % — AB (ref 11.5–14.5)
WBC: 14.3 10*3/uL — ABNORMAL HIGH (ref 3.6–11.0)

## 2016-07-17 LAB — COMPREHENSIVE METABOLIC PANEL
ALT: 26 U/L (ref 14–54)
AST: 46 U/L — AB (ref 15–41)
Albumin: 2.5 g/dL — ABNORMAL LOW (ref 3.5–5.0)
Alkaline Phosphatase: 181 U/L — ABNORMAL HIGH (ref 38–126)
Anion gap: 5 (ref 5–15)
BILIRUBIN TOTAL: 0.4 mg/dL (ref 0.3–1.2)
BUN: 11 mg/dL (ref 6–20)
CHLORIDE: 107 mmol/L (ref 101–111)
CO2: 25 mmol/L (ref 22–32)
CREATININE: 0.71 mg/dL (ref 0.44–1.00)
Calcium: 8.3 mg/dL — ABNORMAL LOW (ref 8.9–10.3)
Glucose, Bld: 83 mg/dL (ref 65–99)
POTASSIUM: 3.6 mmol/L (ref 3.5–5.1)
Sodium: 137 mmol/L (ref 135–145)
TOTAL PROTEIN: 5.7 g/dL — AB (ref 6.5–8.1)

## 2016-07-17 LAB — URIC ACID: URIC ACID, SERUM: 3.7 mg/dL (ref 2.3–6.6)

## 2016-07-17 MED ORDER — NIFEDIPINE ER OSMOTIC RELEASE 30 MG PO TB24
30.0000 mg | ORAL_TABLET | Freq: Every day | ORAL | Status: DC
  Administered 2016-07-17 – 2016-07-18 (×2): 30 mg via ORAL
  Filled 2016-07-17 (×2): qty 1

## 2016-07-17 NOTE — Progress Notes (Signed)
Admit Date: 07/13/2016 Today's Date: 07/17/2016  Subjective: Postpartum Day 2: Cesarean Delivery Patient reports tolerating PO, + flatus and no problems voiding.   Pt has h/o cHTN and had elevated BPs begin last night (after normal immediately PP).  No headache, blurry vision, CP, SOB.  Some edema.  Objective: Vital signs in last 24 hours: Temp:  [97.8 F (36.6 C)-99.1 F (37.3 C)] 99.1 F (37.3 C) (03/04 0346) Pulse Rate:  [88-106] 88 (03/04 0346) Resp:  [17-18] 18 (03/04 0346) BP: (147-154)/(82-88) 153/85 (03/04 0346) SpO2:  [100 %] 100 % (03/04 0346)  Physical Exam:  General: alert, cooperative and no distress Lochia: appropriate Uterine Fundus: firm Incision: healing well DVT Evaluation: No evidence of DVT seen on physical exam. Negative Homan's sign. Tr edema  Results for orders placed or performed during the hospital encounter of 07/13/16  OB RESULT CONSOLE Group B Strep  Result Value Ref Range   GBS Negative   Chlamydia/NGC rt PCR ([facility] only)  Result Value Ref Range   Specimen source GC/Chlam URINE, RANDOM    Chlamydia Tr NOT DETECTED NOT DETECTED   N gonorrhoeae NOT DETECTED NOT DETECTED  CBC  Result Value Ref Range   WBC 9.1 3.6 - 11.0 K/uL   RBC 3.94 3.80 - 5.20 MIL/uL   Hemoglobin 12.5 12.0 - 16.0 g/dL   HCT 36.3 35.0 - 47.0 %   MCV 92.2 80.0 - 100.0 fL   MCH 31.7 26.0 - 34.0 pg   MCHC 34.3 32.0 - 36.0 g/dL   RDW 14.8 (H) 11.5 - 14.5 %   Platelets 413 150 - 440 K/uL  RPR  Result Value Ref Range   RPR Ser Ql Non Reactive Non Reactive  Comprehensive metabolic panel  Result Value Ref Range   Sodium 136 135 - 145 mmol/L   Potassium 3.8 3.5 - 5.1 mmol/L   Chloride 107 101 - 111 mmol/L   CO2 22 22 - 32 mmol/L   Glucose, Bld 100 (H) 65 - 99 mg/dL   BUN 9 6 - 20 mg/dL   Creatinine, Ser 0.80 0.44 - 1.00 mg/dL   Calcium 9.0 8.9 - 10.3 mg/dL   Total Protein 6.4 (L) 6.5 - 8.1 g/dL   Albumin 2.9 (L) 3.5 - 5.0 g/dL   AST 31 15 - 41 U/L   ALT 19 14 - 54 U/L   Alkaline Phosphatase 165 (H) 38 - 126 U/L   Total Bilirubin 0.6 0.3 - 1.2 mg/dL   GFR calc non Af Amer >60 >60 mL/min   GFR calc Af Amer >60 >60 mL/min   Anion gap 7 5 - 15  Protein / creatinine ratio, urine  Result Value Ref Range   Creatinine, Urine 79 mg/dL   Total Protein, Urine 6 mg/dL   Protein Creatinine Ratio 0.08 0.00 - 0.15 mg/mg[Cre]  Rapid HIV screen (HIV 1/2 Ab+Ag) ([facility] Only)  Result Value Ref Range   HIV-1 P24 Antigen - HIV24 NON REACTIVE NON REACTIVE   HIV 1/2 Antibodies NON REACTIVE NON REACTIVE   Interpretation (HIV Ag Ab)      A non reactive test result means that HIV 1 or HIV 2 antibodies and HIV 1 p24 antigen were not detected in the specimen.  OB RESULTS CONSOLE RPR  Result Value Ref Range   RPR Nonreactive   OB RESULTS CONSOLE HIV antibody  Result Value Ref Range   HIV Non-reactive   OB RESULTS CONSOLE Rubella Antibody  Result Value Ref Range   Rubella Immune  OB RESULTS CONSOLE Varicella zoster antibody, IgG  Result Value Ref Range   Varicella Immune   OB RESULTS CONSOLE Hepatitis B surface antigen  Result Value Ref Range   Hepatitis B Surface Ag Negative   Glucose, capillary  Result Value Ref Range   Glucose-Capillary 83 65 - 99 mg/dL  Glucose, capillary  Result Value Ref Range   Glucose-Capillary 68 65 - 99 mg/dL  CBC  Result Value Ref Range   WBC 9.6 3.6 - 11.0 K/uL   RBC 3.94 3.80 - 5.20 MIL/uL   Hemoglobin 12.5 12.0 - 16.0 g/dL   HCT 36.9 35.0 - 47.0 %   MCV 93.5 80.0 - 100.0 fL   MCH 31.7 26.0 - 34.0 pg   MCHC 33.9 32.0 - 36.0 g/dL   RDW 14.8 (H) 11.5 - 14.5 %   Platelets 376 150 - 440 K/uL  Glucose, capillary  Result Value Ref Range   Glucose-Capillary 65 65 - 99 mg/dL  Glucose, capillary  Result Value Ref Range   Glucose-Capillary 112 (H) 65 - 99 mg/dL  Glucose, capillary  Result Value Ref Range   Glucose-Capillary 107 (H) 65 - 99 mg/dL  CBC  Result Value Ref Range   WBC 16.6 (H) 3.6 - 11.0 K/uL   RBC 3.47 (L) 3.80 - 5.20  MIL/uL   Hemoglobin 10.9 (L) 12.0 - 16.0 g/dL   HCT 32.7 (L) 35.0 - 47.0 %   MCV 94.2 80.0 - 100.0 fL   MCH 31.3 26.0 - 34.0 pg   MCHC 33.2 32.0 - 36.0 g/dL   RDW 14.6 (H) 11.5 - 14.5 %   Platelets 321 150 - 440 K/uL  CBC with Differential/Platelet  Result Value Ref Range   WBC 14.3 (H) 3.6 - 11.0 K/uL   RBC 3.38 (L) 3.80 - 5.20 MIL/uL   Hemoglobin 10.5 (L) 12.0 - 16.0 g/dL   HCT 31.9 (L) 35.0 - 47.0 %   MCV 94.4 80.0 - 100.0 fL   MCH 31.2 26.0 - 34.0 pg   MCHC 33.0 32.0 - 36.0 g/dL   RDW 15.2 (H) 11.5 - 14.5 %   Platelets 363 150 - 440 K/uL   Neutrophils Relative % 76 %   Neutro Abs 10.9 (H) 1.4 - 6.5 K/uL   Lymphocytes Relative 11 %   Lymphs Abs 1.6 1.0 - 3.6 K/uL   Monocytes Relative 9 %   Monocytes Absolute 1.3 (H) 0.2 - 0.9 K/uL   Eosinophils Relative 3 %   Eosinophils Absolute 0.4 0 - 0.7 K/uL   Basophils Relative 1 %   Basophils Absolute 0.1 0 - 0.1 K/uL  Comprehensive metabolic panel  Result Value Ref Range   Sodium 137 135 - 145 mmol/L   Potassium 3.6 3.5 - 5.1 mmol/L   Chloride 107 101 - 111 mmol/L   CO2 25 22 - 32 mmol/L   Glucose, Bld 83 65 - 99 mg/dL   BUN 11 6 - 20 mg/dL   Creatinine, Ser 0.71 0.44 - 1.00 mg/dL   Calcium 8.3 (L) 8.9 - 10.3 mg/dL   Total Protein 5.7 (L) 6.5 - 8.1 g/dL   Albumin 2.5 (L) 3.5 - 5.0 g/dL   AST 46 (H) 15 - 41 U/L   ALT 26 14 - 54 U/L   Alkaline Phosphatase 181 (H) 38 - 126 U/L   Total Bilirubin 0.4 0.3 - 1.2 mg/dL   GFR calc non Af Amer >60 >60 mL/min   GFR calc Af Amer >60 >60 mL/min  Anion gap 5 5 - 15  Uric acid  Result Value Ref Range   Uric Acid, Serum 3.7 2.3 - 6.6 mg/dL  Type and screen  Result Value Ref Range   ABO/RH(D) A POS    Antibody Screen NEG    Sample Expiration 07/16/2016    Assessment/Plan: Status post Cesarean section. Doing well postoperatively.  Continue current care. BS WNL. BP starting to creep up; labs normal for preeclampsia eval.  Likely cHTN.      Pt was on Cartia and HCTZ prior to  preg.  Counseled these are not rec in breast feeding.    Pt did not tol Labetalol well in preg\    Will start Procardia and monitor today for SE and results Infant doing well, breast feeding  Hoyt Koch 07/17/2016, 10:03 AM

## 2016-07-18 MED ORDER — NIFEDIPINE ER 30 MG PO TB24: 30.0000 mg | ORAL_TABLET | Freq: Every day | ORAL | 1 refills | Status: DC

## 2016-07-18 MED ORDER — OXYCODONE-ACETAMINOPHEN 5-325 MG PO TABS: 1.0000 | ORAL_TABLET | ORAL | 0 refills | Status: DC | PRN

## 2016-07-18 NOTE — Discharge Instructions (Signed)
Please call your doctor or return to the ER if you experience any chest pains, shortness of breath, fever greater than 101, any heavy bleeding or large clots, and foul smelling vaginal discharge, any worsening abdominal pain & cramping that is not controlled by pain medication, or any signs of post partum depression. Please check your incision daily for redness, heat, and drainage. No tampons, enemas, douches, or sexual intercourse for 6 weeks.  Also avoid tub baths, hot tubs, or swimming for 6 weeks.  Cesarean Delivery, Care After Refer to this sheet in the next few weeks. These instructions provide you with information about caring for yourself after your procedure. Your health care provider may also give you more specific instructions. Your treatment has been planned according to current medical practices, but problems sometimes occur. Call your health care provider if you have any problems or questions after your procedure. What can I expect after the procedure? After the procedure, it is common to have:  A small amount of blood or clear fluid coming from the incision.  Some redness, swelling, and pain in your incision area.  Some abdominal pain and soreness.  Vaginal bleeding (lochia).  Pelvic cramps.  Fatigue. Follow these instructions at home: Incision care    Follow instructions from your health care provider about how to take care of your incision. Make sure you:  Wash your hands with soap and water before you change your bandage (dressing). If soap and water are not available, use hand sanitizer.  Change your dressing as told by your health care provider.  Leave stitches (sutures), skin staples, skin glue, or adhesive strips in place. These skin closures may need to stay in place for 2 weeks or longer. If adhesive strip edges start to loosen and curl up, you may trim the loose edges. Do not remove adhesive strips completely unless your health care provider tells you to do  that.  Check your incision area every day for signs of infection. Check for:  More redness, swelling, or pain.  More fluid or blood.  Warmth.  Pus or a bad smell.  When you cough or sneeze, hug a pillow. This helps with pain and decreases the chance of your incision opening up (dehiscing). Do this until your incision heals. Medicines   Take over-the-counter and prescription medicines only as told by your health care provider.  If you were prescribed an antibiotic medicine, take it as told by your health care provider. Do not stop taking the antibiotic until it is finished. Driving   Do not drive or operate heavy machinery while taking prescription pain medicine.  Do not drive for 24 hours if you received a sedative. Lifestyle   Do not drink alcohol. This is especially important if you are breastfeeding or taking pain medicine.  Do not use tobacco products, including cigarettes, chewing tobacco, or e-cigarettes. If you need help quitting, ask your health care provider. Tobacco can delay wound healing. Eating and drinking   Drink at least 8 eight-ounce glasses of water every day unless told not to by your health care provider. If you breastfeed, you may need to drink more water than this.  Eat high-fiber foods every day. These foods may help prevent or relieve constipation. High-fiber foods include:  Whole grain cereals and breads.  Brown rice.  Beans.  Fresh fruits and vegetables. Activity   Return to your normal activities as told by your health care provider. Ask your health care provider what activities are safe for you.  Rest as much as possible. Try to rest or take a nap while your baby is sleeping.  Do not lift anything that is heavier than your baby or 10 lb (4.5 kg) as told by your health care provider.  Ask your health care provider when you can engage in sexual activity. This may depend on your:  Risk of infection.  Healing rate.  Comfort and desire to  engage in sexual activity. Bathing   Do not take baths, swim, or use a hot tub until your health care provider approves. Ask your health care provider if you can take showers. You may only be allowed to take sponge baths until your incision heals.  Keep your dressing dry as told by your health care provider. General instructions   Do not use tampons or douches until your health care provider approves.  Wear:  Loose, comfortable clothing.  A supportive and well-fitting bra.  Watch for any blood clots that may pass from your vagina. These may look like clumps of dark red, brown, or black discharge.  Keep your perineum clean and dry as told by your health care provider.  Wipe from front to back when you use the toilet.  If possible, have someone help you care for your baby and help with household activities for a few days after you leave the hospital.  Keep all follow-up visits for you and your baby as told by your health care provider. This is important. Contact a health care provider if:  You have:  Bad-smelling vaginal discharge.  Difficulty urinating.  Pain when urinating.  A sudden increase or decrease in the frequency of your bowel movements.  More redness, swelling, or pain around your incision.  More fluid or blood coming from your incision.  Pus or a bad smell coming from your incision.  A fever.  A rash.  Little or no interest in activities you used to enjoy.  Questions about caring for yourself or your baby.  Nausea.  Your incision feels warm to the touch.  Your breasts turn red or become painful or hard.  You feel unusually sad or worried.  You vomit.  You pass large blood clots from your vagina. If you pass a blood clot, save it to show to your health care provider. Do not flush blood clots down the toilet without showing your health care provider.  You urinate more than usual.  You are dizzy or light-headed.  You have not breastfed and  have not had a menstrual period for 12 weeks after delivery.  You stopped breastfeeding and have not had a menstrual period for 12 weeks after stopping breastfeeding. Get help right away if:  You have:  Pain that does not go away or get better with medicine.  Chest pain.  Difficulty breathing.  Blurred vision or spots in your vision.  Thoughts about hurting yourself or your baby.  New pain in your abdomen or in one of your legs.  A severe headache.  You faint.  You bleed from your vagina so much that you fill two sanitary pads in one hour. This information is not intended to replace advice given to you by your health care provider. Make sure you discuss any questions you have with your health care provider. Document Released: 01/22/2002 Document Revised: 09/10/2015 Document Reviewed: 04/06/2015 Elsevier Interactive Patient Education  2017 [employer].

## 2016-07-18 NOTE — Progress Notes (Signed)
D/C order from MD.  Reviewed d/c instructions and prescriptions with patient and answered any questions.  Patient d/c home with infant via wheelchair by nursing/auxillary. 

## 2016-07-21 ENCOUNTER — Ambulatory Visit: Payer: BC Managed Care – PPO | Admitting: Obstetrics & Gynecology

## 2016-07-21 ENCOUNTER — Ambulatory Visit: Payer: BC Managed Care – PPO | Admitting: Obstetrics and Gynecology

## 2016-07-27 ENCOUNTER — Ambulatory Visit: Payer: BC Managed Care – PPO | Admitting: Obstetrics and Gynecology

## 2016-07-27 ENCOUNTER — Telehealth: Payer: Self-pay | Admitting: Obstetrics and Gynecology

## 2016-07-27 NOTE — Telephone Encounter (Signed)
Found papers at front desk. [employer] papers had not been filled out & payment had not been made as patient had given paperwork to [place]. Explained process to patient. She states she had made all OB payments which included paper work. Paperwork is usually handled separately. Patient agreed to pay today by phone and expects refund if she indeed had already paid. Pt transferred to [facility] to make payment. Pertinent info needed to complete paperwork obtained from patient. I will complete paperwork & fax. Will contact patient once confirmation is received.

## 2016-07-27 NOTE — Telephone Encounter (Signed)
Notified patietn FMLA Paper work completed/faxed/confirmation received.

## 2016-07-27 NOTE — Telephone Encounter (Signed)
Pt is calling today to find out about her [employer] Paper work. Please advise. Pt would like an call back due to her HR is asking for Paper work. Cb#[phone]

## 2016-07-27 NOTE — Telephone Encounter (Signed)
Pt inquiring about FMLA papers dropped off 06/20/16. Her HR dept needs them today.

## 2016-08-25 ENCOUNTER — Encounter: Payer: BC Managed Care – PPO | Admitting: Obstetrics and Gynecology

## 2016-08-25 LAB — HM PAP SMEAR

## 2016-08-29 ENCOUNTER — Other Ambulatory Visit: Payer: Self-pay | Admitting: Obstetrics and Gynecology

## 2016-08-29 MED ORDER — NORETHINDRONE ACETATE 5 MG PO TABS: 5.0000 mg | ORAL_TABLET | Freq: Every day | ORAL | 11 refills | Status: DC

## 2016-08-31 ENCOUNTER — Encounter: Payer: Self-pay | Admitting: Obstetrics and Gynecology

## 2016-09-02 ENCOUNTER — Other Ambulatory Visit: Payer: Self-pay | Admitting: Obstetrics and Gynecology

## 2016-09-02 MED ORDER — SERTRALINE HCL 50 MG PO TABS: 50.0000 mg | ORAL_TABLET | Freq: Every day | ORAL | 2 refills | Status: DC

## 2016-09-15 ENCOUNTER — Ambulatory Visit (INDEPENDENT_AMBULATORY_CARE_PROVIDER_SITE_OTHER): Payer: BC Managed Care – PPO | Admitting: Obstetrics and Gynecology

## 2016-09-15 ENCOUNTER — Encounter: Payer: Self-pay | Admitting: Obstetrics and Gynecology

## 2016-09-15 VITALS — BP 126/80 | HR 70 | Ht 62.0 in | Wt 191.0 lb

## 2016-09-15 DIAGNOSIS — R87612 Low grade squamous intraepithelial lesion on cytologic smear of cervix (LGSIL): Secondary | ICD-10-CM | POA: Diagnosis not present

## 2016-09-15 DIAGNOSIS — R87618 Other abnormal cytological findings on specimens from cervix uteri: Secondary | ICD-10-CM

## 2016-09-15 DIAGNOSIS — R8789 Other abnormal findings in specimens from female genital organs: Secondary | ICD-10-CM | POA: Diagnosis not present

## 2016-09-15 NOTE — Progress Notes (Signed)
GYNECOLOGY CLINIC COLPOSCOPY PROCEDURE NOTE  41 y.o. [id number] here for colposcopy for LSIL HPV positive pap smear on within the last 2 months. 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  Is the patient  pregnant: No LMP: Patient's last menstrual period was 08/23/2016 (exact date). Smoking status:  Metrics: Intervention Frequency ACO  Documented Smoking Status Yearly  Screened one or more times in 24 months  Cessation Counseling or  Active cessation medication Past 24 months  Past 24 months   Guideline developer: UpToDate (See UpToDate for funding source) Date Released: 2014  Patient given informed consent, signed copy in the chart, time out was performed.  The patient was position in dorsal lithotomy position. Speculum was placed the cervix was visualized.   After application of acetic acid colposcopic inspection of the cervix was undertaken.   Colposcopy adequate, full visualization of transformation zone: Yes no visible lesions; random biopsies obtained.   ECC specimen obtained:  Yes All specimens were labeled and sent to pathology.   Patient was given post procedure instructions.  Will follow up pathology and manage accordingly.  Routine preventative health maintenance measures emphasized.  Physical Exam  Genitourinary:      Malachy Mood, MD, [facility], [place]

## 2016-09-21 ENCOUNTER — Encounter: Payer: Self-pay | Admitting: Obstetrics and Gynecology

## 2016-09-21 LAB — PATHOLOGY

## 2016-10-27 ENCOUNTER — Encounter: Payer: Self-pay | Admitting: Obstetrics and Gynecology

## 2016-10-28 ENCOUNTER — Other Ambulatory Visit: Payer: Self-pay | Admitting: Obstetrics and Gynecology

## 2016-10-28 MED ORDER — HYDROCHLOROTHIAZIDE 25 MG PO TABS: 25.0000 mg | ORAL_TABLET | Freq: Every day | ORAL | 1 refills | Status: DC

## 2016-10-28 MED ORDER — DILTIAZEM HCL ER COATED BEADS 120 MG PO CP24: 120.0000 mg | ORAL_CAPSULE | Freq: Every day | ORAL | 1 refills | Status: DC

## 2016-10-28 NOTE — Progress Notes (Signed)
Rx RF for HTN meds. RTO in the month to f/u.

## 2016-12-12 ENCOUNTER — Telehealth: Payer: Self-pay

## 2016-12-12 NOTE — Telephone Encounter (Signed)
Pt needs note releasing her to work.  [phone]

## 2016-12-12 NOTE — Telephone Encounter (Signed)
Pt has not seen AMS since May, but has a follow up appointment w/[facility]. Please call and get more information. Thanks

## 2016-12-13 ENCOUNTER — Encounter: Payer: Self-pay | Admitting: Obstetrics and Gynecology

## 2016-12-13 NOTE — Telephone Encounter (Signed)
Pt states she delivered in March and chose to be out this long.  Adv we can only give six week maternity leave.  Anything beyond that is between her and employer.  Per pt employer still needs note releasing her to go back to work.

## 2016-12-13 NOTE — Telephone Encounter (Signed)
Please advise 

## 2016-12-27 ENCOUNTER — Ambulatory Visit (INDEPENDENT_AMBULATORY_CARE_PROVIDER_SITE_OTHER): Payer: BC Managed Care – PPO | Admitting: Obstetrics and Gynecology

## 2016-12-27 ENCOUNTER — Encounter: Payer: Self-pay | Admitting: Obstetrics and Gynecology

## 2016-12-27 VITALS — BP 138/84 | Ht 62.0 in | Wt 200.0 lb

## 2016-12-27 DIAGNOSIS — I10 Essential (primary) hypertension: Secondary | ICD-10-CM

## 2016-12-27 MED ORDER — HYDROCHLOROTHIAZIDE 25 MG PO TABS: 25.0000 mg | ORAL_TABLET | Freq: Every day | ORAL | 8 refills | Status: DC

## 2016-12-27 MED ORDER — DILTIAZEM HCL ER COATED BEADS 120 MG PO CP24: 120.0000 mg | ORAL_CAPSULE | Freq: Every day | ORAL | 8 refills | Status: AC

## 2016-12-27 NOTE — Progress Notes (Signed)
Chief Complaint  Patient presents with  . Follow-up    BP follow up     HPI:      [name] is a 41 y.o. G2P1002 who LMP was No LMP recorded., presents today for her BP check. She has a hx of HTN and had gestational HTN with recent pregnancy. Pt was restarted on HCTZ and cardizem by Dr. Georgianne Fick at [facility] check 4/18. She is due for BP f/u and med check. She denies any side effects. At home BP checks are usually 130s/80s.  She is also on camila OCPs for [facility] and has irregular bleeding with them.   She is due for annual 4/19.   Past Medical History:  Diagnosis Date  . Acne   . Breast lump 2013   right breast   . Chicken pox   . Fibroids    Uterine, dx on U/S  . HTN (hypertension) 2003  . Hyperlipidemia   . Preeclampsia 2003  . Syncope and collapse      Current Outpatient Prescriptions:  .  norethindrone (MICRONOR,CAMILA,ERRIN) 0.35 MG tablet, Take 1 tablet by mouth daily., Disp: , Rfl:  .  diltiazem (CARDIZEM CD) 120 MG 24 hr capsule, Take 1 capsule (120 mg total) by mouth daily., Disp: 30 capsule, Rfl: 8 .  hydrochlorothiazide (HYDRODIURIL) 25 MG tablet, Take 1 tablet (25 mg total) by mouth daily., Disp: 30 tablet, Rfl: 8  Review of Systems  Constitutional: Negative for fever, malaise/fatigue and weight loss.  HENT: Negative for congestion, ear pain and sinus pain.   Respiratory: Negative for cough, shortness of breath and wheezing.   Cardiovascular: Negative for chest pain, orthopnea and leg swelling.  Gastrointestinal: Negative for constipation, diarrhea, nausea and vomiting.  Genitourinary: Negative for dysuria, frequency, hematuria and urgency.       Breast ROS: negative   Musculoskeletal: Positive for joint pain. Negative for back pain and myalgias.  Skin: Negative for itching and rash.  Neurological: Negative for dizziness, tingling, focal weakness and headaches.  Endo/Heme/Allergies: Negative for environmental allergies. Does not bruise/bleed easily.    Psychiatric/Behavioral: Negative for depression and suicidal ideas. The patient is not nervous/anxious and does not have insomnia.     BP 138/84   Ht 5\' 2"  (1.575 m)   Wt 200 lb (90.7 kg)   BMI 36.58 kg/m   Physical Exam  Constitutional: She is oriented to person, place, and time and well-developed, well-nourished, and in no distress.  Neurological: She is alert and oriented to person, place, and time.  Psychiatric: Mood, memory, affect and judgment normal.  Vitals reviewed.   ASSESSMENT/PLAN:  Essential hypertension - BPs controlled with meds. Pt plans for wt loss with diet/exercise changes. Cont to check BPs at home. F/u if >140/90. F/u at 4/19 annual/sooner prn. - Plan: hydrochlorothiazide (HYDRODIURIL) 25 MG tablet, diltiazem (CARDIZEM CD) 120 MG 24 hr capsule    Meds ordered this encounter  Medications  . norethindrone (MICRONOR,CAMILA,ERRIN) 0.35 MG tablet    Sig: Take 1 tablet by mouth daily.  . hydrochlorothiazide (HYDRODIURIL) 25 MG tablet    Sig: Take 1 tablet (25 mg total) by mouth daily.    Dispense:  30 tablet    Refill:  8  . diltiazem (CARDIZEM CD) 120 MG 24 hr capsule    Sig: Take 1 capsule (120 mg total) by mouth daily.    Dispense:  30 capsule    Refill:  8     Return if symptoms worsen or fail to improve.  Idy Rawling B. Agnes Probert, PA-C 12/27/2016 11:14 AM

## 2016-12-30 ENCOUNTER — Ambulatory Visit: Payer: BC Managed Care – PPO | Admitting: Podiatry

## 2017-02-03 ENCOUNTER — Telehealth: Payer: Self-pay

## 2017-02-03 NOTE — Telephone Encounter (Signed)
Pt c/o pain at c/s incision.  Normal?  Abnormal? Common? Be seen?  [phone]

## 2017-02-03 NOTE — Telephone Encounter (Signed)
Nothing emergent, C/S was in May. Please schedule her next week...does not have to be double booked. Can be any of the MD's. Thanks

## 2017-02-06 NOTE — Telephone Encounter (Signed)
Pt is schedule with Dr. Georgianne Fick 02/13/17

## 2017-02-13 ENCOUNTER — Encounter: Payer: Self-pay | Admitting: Obstetrics and Gynecology

## 2017-02-13 ENCOUNTER — Ambulatory Visit (INDEPENDENT_AMBULATORY_CARE_PROVIDER_SITE_OTHER): Payer: BC Managed Care – PPO | Admitting: Obstetrics and Gynecology

## 2017-02-13 VITALS — BP 128/80 | HR 85 | Ht 62.5 in | Wt 199.0 lb

## 2017-02-13 DIAGNOSIS — G8918 Other acute postprocedural pain: Secondary | ICD-10-CM

## 2017-02-13 DIAGNOSIS — O9089 Other complications of the puerperium, not elsewhere classified: Secondary | ICD-10-CM

## 2017-02-13 DIAGNOSIS — M792 Neuralgia and neuritis, unspecified: Secondary | ICD-10-CM | POA: Diagnosis not present

## 2017-02-13 NOTE — Progress Notes (Signed)
Obstetrics & Gynecology Office Visit   Chief Complaint:  Chief Complaint  Patient presents with  . Wound Check    Random incision pain internally on right side    History of Present Illness: 41 yo AAF s/p primary LTCS in March of 2018 who had four week of intermittent sharp right lower quadrant pain at the site of her C-section incision.  No dyspareunia, no temporal relation to menstrual cycle (IUD).  She has not noted any abdominal masses, skin changes, fevers or chills, or inciting event.  The pain is sudden onset, of short duration, and described as a sharp shooting type pain.   Review of Systems: 10 point review of systems negative unless otherwise noted in HPI  Past Medical History:  Past Medical History:  Diagnosis Date  . Acne   . Breast lump 2013   right breast   . Chicken pox   . Fibroids    Uterine, dx on U/S  . HTN (hypertension) 2003  . Hyperlipidemia   . Preeclampsia 2003  . Syncope and collapse     Past Surgical History:  Past Surgical History:  Procedure Laterality Date  . BREAST BIOPSY Right 01/23/2012  . CESAREAN SECTION N/A 07/15/2016   Procedure: CESAREAN SECTION;  Surgeon: Malachy Mood, MD;  Location: [facility] ORS;  Service: Obstetrics;  Laterality: N/A;    Gynecologic History: No LMP recorded.  Obstetric History: G2P1002  Family History:  Family History  Problem Relation Age of Onset  . Skin cancer Father   . Hypertension Father   . Hypertension Mother   . Prostate cancer Maternal Grandfather     Social History:  Social History   Social History  . Marital status: Married    Spouse name: N/A  . Number of children: N/A  . Years of education: N/A   Occupational History  . educator    Social History Main Topics  . Smoking status: Never Smoker  . Smokeless tobacco: Never Used  . Alcohol use Yes     Comment: occasionally  . Drug use: No  . Sexual activity: Yes   Other Topics Concern  . Not on file   Social History Narrative    . No narrative on file    Allergies:  No Known Allergies  Medications: Prior to Admission medications   Medication Sig Start Date End Date Taking? Authorizing Provider  diltiazem (CARDIZEM CD) 120 MG 24 hr capsule Take 1 capsule (120 mg total) by mouth daily. 0/03/49  Yes Copland, Deirdre Evener, PA-C  hydrochlorothiazide (HYDRODIURIL) 25 MG tablet Take 1 tablet (25 mg total) by mouth daily. 1/79/15  Yes Copland, Deirdre Evener, PA-C  norethindrone (MICRONOR,CAMILA,ERRIN) 0.35 MG tablet Take 1 tablet by mouth daily.   Yes [provider]    Physical Exam Vitals:  Vitals:   02/13/17 1137  BP: 128/80  Pulse: 85   No LMP recorded.  General: NAD HEENT: normocephalic, anicteric Pulmonary: No increased work of breathing Abdomen: Soft, non-tender, non-distended.  Umbilicus without lesions.  No hepatomegaly, splenomegaly or masses palpable. No evidence of hernia.  Incision intact and well healed with no overlying or palpable changes. Extremities: no edema, erythema, or tenderness Neurologic: Grossly intact Psychiatric: mood appropriate, affect full  Female chaperone present for pelvic and breast  portions of the physical exam  Assessment: 41 y.o. G2P1002 incision check  Plan: Problem List Items Addressed This Visit    None    Visit Diagnoses    Neuropathic pain    -  Primary   Pain following cesarean delivery         Pain is consistent with neuropathic pain.  We discussed nerve regeneration may take up to 12 months following surgery.  At time this may cause symptoms similar to what she is experiencing currently.  We discussed use of topical anaesthetic preparations and gabapentin.  At present symptoms have resolved and patient simply wanted reassurance that there were no issues with the incision.  Reassurance provided and continued recovery is expected.

## 2017-02-17 NOTE — Telephone Encounter (Signed)
Letter done 12/13/16.

## 2017-03-13 ENCOUNTER — Encounter: Payer: Self-pay | Admitting: Obstetrics and Gynecology

## 2017-04-03 ENCOUNTER — Other Ambulatory Visit: Payer: Self-pay

## 2017-04-03 ENCOUNTER — Telehealth: Payer: Self-pay | Admitting: Obstetrics and Gynecology

## 2017-04-03 ENCOUNTER — Ambulatory Visit (INDEPENDENT_AMBULATORY_CARE_PROVIDER_SITE_OTHER): Payer: BC Managed Care – PPO | Admitting: Obstetrics and Gynecology

## 2017-04-03 ENCOUNTER — Encounter: Payer: Self-pay | Admitting: Obstetrics and Gynecology

## 2017-04-03 VITALS — BP 134/80 | HR 80 | Temp 98.7°F | Ht 62.0 in | Wt 193.0 lb

## 2017-04-03 DIAGNOSIS — J00 Acute nasopharyngitis [common cold]: Secondary | ICD-10-CM

## 2017-04-03 DIAGNOSIS — R05 Cough: Secondary | ICD-10-CM

## 2017-04-03 DIAGNOSIS — R059 Cough, unspecified: Secondary | ICD-10-CM

## 2017-04-03 NOTE — Telephone Encounter (Signed)
Just put her in where there is an opening. Thx.

## 2017-04-03 NOTE — Patient Instructions (Signed)
I value your feedback and entrusting us with your care. If you get a [facility] patient survey, I would appreciate you taking the time to let us know about your experience today. Thank you! 

## 2017-04-03 NOTE — Progress Notes (Signed)
Chief Complaint  Patient presents with  . Cough    x4 days productive w/clear sputum    HPI:      Rebekah Brady is a 41 y.o. G2P1002 who LMP was No LMP recorded., presents today for cough triggered by URI sx last wk. Pt first had sore throat, nasal congestion, rhinorrhea starting 03/29/17.  No fevers. Those sx have improved but pt now has a cough. Worse at night, usually non-productive. No face, teeth, ear pain. Pt tried a cold medicine over-the-counter with a little improvement. She has HTN. Cough can be hacking and is giving pt a headache.   Past Medical History:  Diagnosis Date  . Acne   . Breast lump 2013   right breast   . Chicken pox   . Fibroids    Uterine, dx on U/S  . HTN (hypertension) 2003  . Hyperlipidemia   . Preeclampsia 2003  . Syncope and collapse     Past Surgical History:  Procedure Laterality Date  . BREAST BIOPSY Right 01/23/2012  . CESAREAN SECTION N/A 07/15/2016   Performed by Malachy Mood, MD at [facility]  . CESAREAN SECTION N/A 07/14/2016   Performed by Malachy Mood, MD at [facility]S    Family History  Problem Relation Age of Onset  . Skin cancer Father   . Hypertension Father   . Hypertension Mother   . Prostate cancer Maternal Grandfather     Social History   Socioeconomic History  . Marital status: Married    Spouse name: Not on file  . Number of children: Not on file  . Years of education: Not on file  . Highest education level: Not on file  Social Needs  . Financial resource strain: Not on file  . Food insecurity - worry: Not on file  . Food insecurity - inability: Not on file  . Transportation needs - medical: Not on file  . Transportation needs - non-medical: Not on file  Occupational History  . Occupation: educator  Tobacco Use  . Smoking status: Never Smoker  . Smokeless tobacco: Never Used  Substance and Sexual Activity  . Alcohol use: Yes    Comment: occasionally  . Drug use: No  . Sexual activity: Yes    Other Topics Concern  . Not on file  Social History Narrative  . Not on file     Current Outpatient Medications:  .  diltiazem (CARDIZEM CD) 120 MG 24 hr capsule, Take 1 capsule (120 mg total) by mouth daily., Disp: 30 capsule, Rfl: 8 .  hydrochlorothiazide (HYDRODIURIL) 25 MG tablet, Take 1 tablet (25 mg total) by mouth daily., Disp: 30 tablet, Rfl: 8 .  norethindrone (MICRONOR,CAMILA,ERRIN) 0.35 MG tablet, Take 1 tablet by mouth daily., Disp: , Rfl:    ROS:  Review of Systems  Constitutional: Negative for chills, fatigue and fever.  HENT: Positive for congestion, postnasal drip, rhinorrhea and sore throat. Negative for ear pain, sinus pressure and sinus pain.   Respiratory: Positive for cough and chest tightness. Negative for shortness of breath and wheezing.   Skin: Negative for rash.  Neurological: Positive for headaches. Negative for dizziness and light-headedness.     OBJECTIVE:   Vitals:  BP 134/80 (BP Location: Left Arm, Patient Position: Sitting, Cuff Size: Normal)   Pulse 80   Temp 98.7 F (37.1 C)   Ht 5\' 2"  (1.575 m)   Wt 193 lb (87.5 kg)   Breastfeeding? No   BMI 35.30 kg/m  Physical Exam  Constitutional: She is oriented to person, place, and time and well-developed, well-nourished, and in no distress.  Neck: Normal range of motion. No thyromegaly present.  Cardiovascular: Normal rate and regular rhythm.  Pulmonary/Chest: Effort normal and breath sounds normal. No respiratory distress. She has no wheezes. She has no rales.  Lymphadenopathy:       Head (right side): No submandibular, no tonsillar, no preauricular and no posterior auricular adenopathy present.       Head (left side): No submandibular, no tonsillar, no preauricular and no posterior auricular adenopathy present.    She has no cervical adenopathy.  Neurological: She is alert and oriented to person, place, and time.  Psychiatric: Affect and judgment normal.  Vitals  reviewed.   Assessment/Plan: Cough - Neg exam. Related to URI. Try coricidin HBP for decongestant. Delsym prn cough. May need to add tussionex if sx persist.   Acute nasopharyngitis - Flonase/saline nasal spray. Rest/fluids. F/u prn.     Return if symptoms worsen or fail to improve.  Rickardo Brinegar B. Selah Klang, PA-C 04/03/2017 5:05 PM

## 2017-04-03 NOTE — Telephone Encounter (Signed)
Pt is Rquesting an appointment to be seen by Elmo Putt for a severe Cough. Please advise

## 2017-04-10 ENCOUNTER — Encounter: Payer: Self-pay | Admitting: Obstetrics and Gynecology

## 2017-04-10 ENCOUNTER — Other Ambulatory Visit: Payer: Self-pay | Admitting: Obstetrics and Gynecology

## 2017-04-10 DIAGNOSIS — R05 Cough: Secondary | ICD-10-CM

## 2017-04-10 DIAGNOSIS — R059 Cough, unspecified: Secondary | ICD-10-CM

## 2017-04-10 MED ORDER — HYDROCOD POLST-CPM POLST ER 10-8 MG/5ML PO SUER: 5.0000 mL | Freq: Two times a day (BID) | ORAL | 0 refills | Status: DC | PRN

## 2017-04-10 NOTE — Progress Notes (Signed)
Rx tussionex prn cough. Pt to pick up Rx at front desk.

## 2017-06-15 ENCOUNTER — Ambulatory Visit (INDEPENDENT_AMBULATORY_CARE_PROVIDER_SITE_OTHER): Payer: BC Managed Care – PPO | Admitting: Obstetrics and Gynecology

## 2017-06-15 ENCOUNTER — Encounter: Payer: Self-pay | Admitting: Obstetrics and Gynecology

## 2017-06-15 VITALS — BP 140/100 | Ht 62.0 in | Wt 201.0 lb

## 2017-06-15 DIAGNOSIS — J00 Acute nasopharyngitis [common cold]: Secondary | ICD-10-CM

## 2017-06-15 DIAGNOSIS — R05 Cough: Secondary | ICD-10-CM | POA: Diagnosis not present

## 2017-06-15 DIAGNOSIS — I10 Essential (primary) hypertension: Secondary | ICD-10-CM

## 2017-06-15 DIAGNOSIS — R519 Headache, unspecified: Secondary | ICD-10-CM

## 2017-06-15 DIAGNOSIS — R059 Cough, unspecified: Secondary | ICD-10-CM

## 2017-06-15 DIAGNOSIS — R51 Headache: Secondary | ICD-10-CM | POA: Diagnosis not present

## 2017-06-15 NOTE — Progress Notes (Signed)
Chief Complaint  Patient presents with  . Follow-up    COUGH, H/As - H/As are not better, cough is; she gets a little better then cough and h/a start all over again, now has sore throat    HPI:      [name] is a 42 y.o. G2P1002 who LMP was No LMP recorded (lmp unknown)., presents today for URI sx. She was seen 11/18 for URI sx and cough. Sx improved but pt has had several URIs again since. She has a 34 mo old daughter in daycare and is being exposed to viruses. Pt getting about 6hrs interrupted sleep at night. Not taking any Vit D or C supp.  Pt noted URI sx 06/02/17 with nasal congestion, stuffiness, sneezing. She then had a severe HA last wk that lasted about 4 days. Pt took NSAIDs/tylenol/sudafed without relief, but finally got relief with excedrin migraine. HA is mostly resolved now. Has developed raw sore throat. No teeth/ear pain but does have some facial pain. Cough is prod but nothing coming out. She has had some dizziness, fatigue, nausea.  No fevers/chills. Worst sx today is cough.  BP slightly elevated today. Takes HCTZ with sx control usually.   Past Medical History:  Diagnosis Date  . Acne   . Breast lump 2013   right breast   . Chicken pox   . Fibroids    Uterine, dx on U/S  . HTN (hypertension) 2003  . Hyperlipidemia   . Preeclampsia 2003  . Syncope and collapse     Past Surgical History:  Procedure Laterality Date  . BREAST BIOPSY Right 01/23/2012  . CESAREAN SECTION N/A 07/15/2016   Procedure: CESAREAN SECTION;  Surgeon: Malachy Mood, MD;  Location: [facility] ORS;  Service: Obstetrics;  Laterality: N/A;    Family History  Problem Relation Age of Onset  . Skin cancer Father   . Hypertension Father   . Hypertension Mother   . Prostate cancer Maternal Grandfather     Social History   Socioeconomic History  . Marital status: Married    Spouse name: Not on file  . Number of children: Not on file  . Years of education: Not on file  . Highest  education level: Not on file  Social Needs  . Financial resource strain: Not on file  . Food insecurity - worry: Not on file  . Food insecurity - inability: Not on file  . Transportation needs - medical: Not on file  . Transportation needs - non-medical: Not on file  Occupational History  . Occupation: educator  Tobacco Use  . Smoking status: Never Smoker  . Smokeless tobacco: Never Used  Substance and Sexual Activity  . Alcohol use: Yes    Comment: occasionally  . Drug use: No  . Sexual activity: Yes    Birth control/protection: Pill  Other Topics Concern  . Not on file  Social History Narrative  . Not on file     Current Outpatient Medications:  .  diltiazem (CARDIZEM CD) 120 MG 24 hr capsule, Take 1 capsule (120 mg total) by mouth daily., Disp: 30 capsule, Rfl: 8 .  hydrochlorothiazide (HYDRODIURIL) 25 MG tablet, Take 1 tablet (25 mg total) by mouth daily., Disp: 30 tablet, Rfl: 8 .  IRON PO, Take 1 tablet by mouth daily., Disp: , Rfl:  .  norethindrone (AYGESTIN) 5 MG tablet, Take 1 tablet by mouth daily., Disp: , Rfl:    ROS:  Review of Systems  Constitutional: Positive for  fatigue. Negative for chills and fever.  HENT: Positive for congestion, postnasal drip, rhinorrhea and sore throat. Negative for ear pain, sinus pressure and sinus pain.   Respiratory: Positive for cough and chest tightness. Negative for shortness of breath and wheezing.   Gastrointestinal: Positive for nausea. Negative for vomiting.  Skin: Negative for rash.  Neurological: Positive for dizziness, light-headedness and headaches.     OBJECTIVE:   Vitals:  BP (!) 140/100   Ht 5\' 2"  (1.575 m)   Wt 201 lb (91.2 kg)   LMP  (LMP Unknown)   BMI 36.76 kg/m   Physical Exam  Constitutional: She is oriented to person, place, and time and well-developed, well-nourished, and in no distress.  HENT:  Right Ear: Tympanic membrane, external ear and ear canal normal.  Left Ear: Tympanic membrane,  external ear and ear canal normal.  Mouth/Throat: Posterior oropharyngeal erythema present.  FRONTAL AND MAX SINUSES NT TO PALPATE  Neck: Normal range of motion. No thyromegaly present.  Cardiovascular: Normal rate and regular rhythm.  Pulmonary/Chest: Effort normal and breath sounds normal. No respiratory distress. She has no wheezes. She has no rales.  Lymphadenopathy:       Head (right side): No submandibular, no tonsillar, no preauricular and no posterior auricular adenopathy present.       Head (left side): No submandibular, no tonsillar, no preauricular and no posterior auricular adenopathy present.    She has no cervical adenopathy.  Neurological: She is alert and oriented to person, place, and time.  Psychiatric: Affect and judgment normal.  Vitals reviewed.  Assessment/Plan: Cough - Neg lung exam. Most likely PND. Claritin/flonase/coricidin HBP/delsym. F/u prn.   Acute nasopharyngitis - Recurrent. Rest/fluids. Add Vit D and VIt C. Discussed hygiene protocol with infant. Wear mask, wash hands, clorox wipes around house.  Acute intractable headache, unspecified headache type - Sx improved. Question etiology. Not coughing when sx occurred. F/u prn.  Elevated blood pressure reading in office with diagnosis of hypertension - Most likely due to sickness. Keep check at home. Has f/u in 3/19 anyway.    Return if symptoms worsen or fail to improve.  Viney Acocella B. Felicity Penix, PA-C 06/15/2017 2:34 PM

## 2017-06-15 NOTE — Patient Instructions (Signed)
I value your feedback and entrusting us with your care. If you get a [facility] patient survey, I would appreciate you taking the time to let us know about your experience today. Thank you! 

## 2017-09-06 ENCOUNTER — Telehealth: Payer: Self-pay

## 2017-09-06 MED ORDER — NORETHINDRONE ACETATE 5 MG PO TABS: 5.0000 mg | ORAL_TABLET | Freq: Every day | ORAL | 0 refills | Status: DC

## 2017-09-06 NOTE — Telephone Encounter (Signed)
Pt calling for a refill on her bc to be sent to [address].  [phone]  Refill eRx'd for one month.

## 2017-09-06 NOTE — Telephone Encounter (Signed)
Called and left voice mail for patient to call back to be schedule °

## 2017-09-06 NOTE — Telephone Encounter (Signed)
Patient is schedule 09/26/17 with [facility] for Annual. Patient states she only has one pill left. Needs refill please

## 2017-09-06 NOTE — Telephone Encounter (Signed)
Pt aware refill sent in this am.

## 2017-09-26 ENCOUNTER — Ambulatory Visit: Payer: BC Managed Care – PPO | Admitting: Obstetrics and Gynecology

## 2017-09-29 ENCOUNTER — Other Ambulatory Visit: Payer: Self-pay | Admitting: Obstetrics and Gynecology

## 2017-10-05 ENCOUNTER — Other Ambulatory Visit: Payer: Self-pay | Admitting: Obstetrics and Gynecology

## 2017-10-05 DIAGNOSIS — I10 Essential (primary) hypertension: Secondary | ICD-10-CM

## 2017-10-05 NOTE — Telephone Encounter (Signed)
Please advise 

## 2017-10-27 ENCOUNTER — Other Ambulatory Visit: Payer: Self-pay | Admitting: Obstetrics and Gynecology

## 2017-10-27 DIAGNOSIS — Z1231 Encounter for screening mammogram for malignant neoplasm of breast: Secondary | ICD-10-CM

## 2017-10-30 ENCOUNTER — Ambulatory Visit (INDEPENDENT_AMBULATORY_CARE_PROVIDER_SITE_OTHER): Payer: BC Managed Care – PPO | Admitting: Obstetrics and Gynecology

## 2017-10-30 ENCOUNTER — Encounter: Payer: Self-pay | Admitting: Obstetrics and Gynecology

## 2017-10-30 DIAGNOSIS — Z1231 Encounter for screening mammogram for malignant neoplasm of breast: Secondary | ICD-10-CM

## 2017-10-30 DIAGNOSIS — Z1239 Encounter for other screening for malignant neoplasm of breast: Secondary | ICD-10-CM

## 2017-10-30 DIAGNOSIS — Z124 Encounter for screening for malignant neoplasm of cervix: Secondary | ICD-10-CM

## 2017-10-30 DIAGNOSIS — N870 Mild cervical dysplasia: Secondary | ICD-10-CM

## 2017-10-30 DIAGNOSIS — Z01419 Encounter for gynecological examination (general) (routine) without abnormal findings: Secondary | ICD-10-CM

## 2017-10-30 MED ORDER — NORETHINDRONE ACETATE 5 MG PO TABS: ORAL_TABLET | ORAL | 3 refills | Status: DC

## 2017-10-30 NOTE — Patient Instructions (Signed)
[facility] [address] [place] [address]  [facility]  [address] [place] [address]  Phone: [phone]   Preventive Care 18-39 Years, Female Preventive care refers to lifestyle choices and visits with your health care provider that can promote health and wellness. What does preventive care include?  A yearly physical exam. This is also called an annual well check.  Dental exams once or twice a year.  Routine eye exams. Ask your health care provider how often you should have your eyes checked.  Personal lifestyle choices, including: ? Daily care of your teeth and gums. ? Regular physical activity. ? Eating a healthy diet. ? Avoiding tobacco and drug use. ? Limiting alcohol use. ? Practicing safe sex. ? Taking vitamin and mineral supplements as recommended by your health care provider. What happens during an annual well check? The services and screenings done by your health care provider during your annual well check will depend on your age, overall health, lifestyle risk factors, and family history of disease. Counseling Your health care provider may ask you questions about your:  Alcohol use.  Tobacco use.  Drug use.  Emotional well-being.  Home and relationship well-being.  Sexual activity.  Eating habits.  Work and work environment.  Method of birth control.  Menstrual cycle.  Pregnancy history.  Screening You may have the following tests or measurements:  Height, weight, and BMI.  Diabetes screening. This is done by checking your blood sugar (glucose) after you have not eaten for a while (fasting).  Blood pressure.  Lipid and cholesterol levels. These may be checked every 5 years starting at age 20.  Skin check.  Hepatitis C blood test.  Hepatitis B blood test.  Sexually transmitted disease (STD) testing.  BRCA-related cancer screening. This may be done if you have a family history of breast,  ovarian, tubal, or peritoneal cancers.  Pelvic exam and Pap test. This may be done every 3 years starting at age 21. Starting at age 30, this may be done every 5 years if you have a Pap test in combination with an HPV test.  Discuss your test results, treatment options, and if necessary, the need for more tests with your health care provider. Vaccines Your health care provider may recommend certain vaccines, such as:  Influenza vaccine. This is recommended every year.  Tetanus, diphtheria, and acellular pertussis (Tdap, Td) vaccine. You may need a Td booster every 10 years.  Varicella vaccine. You may need this if you have not been vaccinated.  HPV vaccine. If you are 26 or younger, you may need three doses over 6 months.  Measles, mumps, and rubella (MMR) vaccine. You may need at least one dose of MMR. You may also need a second dose.  Pneumococcal 13-valent conjugate (PCV13) vaccine. You may need this if you have certain conditions and were not previously vaccinated.  Pneumococcal polysaccharide (PPSV23) vaccine. You may need one or two doses if you smoke cigarettes or if you have certain conditions.  Meningococcal vaccine. One dose is recommended if you are age 19-21 years and a first-year college student living in a residence hall, or if you have one of several medical conditions. You may also need additional booster doses.  Hepatitis A vaccine. You may need this if you have certain conditions or if you travel or work in places where you may be exposed to hepatitis A.  Hepatitis B vaccine. You may need this if you have certain conditions or   if you travel or work in places where you may be exposed to hepatitis B.  Haemophilus influenzae type b (Hib) vaccine. You may need this if you have certain risk factors.  Talk to your health care provider about which screenings and vaccines you need and how often you need them. This information is not intended to replace advice given to you by  your health care provider. Make sure you discuss any questions you have with your health care provider. Document Released: 06/28/2001 Document Revised: 01/20/2016 Document Reviewed: 03/03/2015 Elsevier Interactive Patient Education  2018 Elsevier Inc.  

## 2017-10-30 NOTE — Progress Notes (Signed)
Patient ID: Rebekah Brady, female   DOB: 1975/07/18, 41 y.o.   MRN: [id number]    Gynecology Annual Exam  PCP: Chad Cordial, PA-C  Chief Complaint:  Chief Complaint  Patient presents with  . Gynecologic Exam    History of Present Illness: Patient is a 42 y.o. G2P1002 presents for annual exam. The patient has no complaints today.   LMP: No LMP recorded. (Menstrual status: Oral contraceptives). Amenorrhea on norethindrone   The patient is sexually active. She currently uses oral progesterone-only contraceptive for contraception. She denies dyspareunia.  The patient does perform self breast exams.  There is no notable family history of breast or ovarian cancer in her family.  The patient wears seatbelts: yes.   The patient has regular exercise: not asked.    The patient denies current symptoms of depression.    Review of Systems: Review of Systems  Constitutional: Negative.   HENT: Positive for congestion and sore throat. Negative for ear discharge, ear pain, hearing loss, nosebleeds, sinus pain and tinnitus.   Eyes: Negative.   Respiratory: Positive for cough. Negative for hemoptysis, sputum production, shortness of breath, wheezing and stridor.   Cardiovascular: Negative.   Gastrointestinal: Negative.   Genitourinary: Negative.   Musculoskeletal: Positive for joint pain. Negative for back pain, myalgias and neck pain.  Skin: Negative.   Neurological: Negative.   Endo/Heme/Allergies: Negative.   Psychiatric/Behavioral: Negative.     Past Medical History:  Past Medical History:  Diagnosis Date  . Acne   . Breast lump 2013   right breast   . Chicken pox   . Fibroids    Uterine, dx on U/S  . HTN (hypertension) 2003  . Hyperlipidemia   . Preeclampsia 2003  . Syncope and collapse     Past Surgical History:  Past Surgical History:  Procedure Laterality Date  . BREAST BIOPSY Right 01/23/2012  . CESAREAN SECTION N/A 07/15/2016   Procedure: CESAREAN SECTION;   Surgeon: Malachy Mood, MD;  Location: [facility] ORS;  Service: Obstetrics;  Laterality: N/A;    Gynecologic History:  No LMP recorded. (Menstrual status: Oral contraceptives). Contraception: oral progesterone-only contraceptive Last Pap: Results were:08/25/2016  LSIL HPV positive  Colposcopy 09/15/2016 CIN I Last mammogram: 01/30/2015 Results were: BI-RAD II  Obstetric History: [id number]  Family History:  Family History  Problem Relation Age of Onset  . Skin cancer Father   . Hypertension Father   . Hypertension Mother   . Prostate cancer Maternal Grandfather     Social History:  Social History   Socioeconomic History  . Marital status: Married    Spouse name: Not on file  . Number of children: Not on file  . Years of education: Not on file  . Highest education level: Not on file  Occupational History  . Occupation: Tourist information centre manager  Social Needs  . Financial resource strain: Not on file  . Food insecurity:    Worry: Not on file    Inability: Not on file  . Transportation needs:    Medical: Not on file    Non-medical: Not on file  Tobacco Use  . Smoking status: Never Smoker  . Smokeless tobacco: Never Used  Substance and Sexual Activity  . Alcohol use: Yes    Comment: occasionally  . Drug use: No  . Sexual activity: Yes    Birth control/protection: Pill  Lifestyle  . Physical activity:    Days per week: Not on file    Minutes per session: Not on file  .  Stress: Not on file  Relationships  . Social connections:    Talks on phone: Not on file    Gets together: Not on file    Attends religious service: Not on file    Active member of club or organization: Not on file    Attends meetings of clubs or organizations: Not on file    Relationship status: Not on file  . Intimate partner violence:    Fear of current or ex partner: Not on file    Emotionally abused: Not on file    Physically abused: Not on file    Forced sexual activity: Not on file  Other Topics Concern    . Not on file  Social History Narrative  . Not on file    Allergies:  No Known Allergies  Medications: Prior to Admission medications   Medication Sig Start Date End Date Taking? Authorizing Provider  diltiazem (CARDIZEM CD) 120 MG 24 hr capsule Take 1 capsule (120 mg total) by mouth daily. 1/82/99  Yes Copland, Elmo Putt B, PA-C  hydrochlorothiazide (HYDRODIURIL) 25 MG tablet TAKE 1 TABLET BY MOUTH ONCE DAILY 3/71/69  Yes Copland, Alicia B, PA-C  norethindrone (AYGESTIN) 5 MG tablet TAKE 1 TABLET BY MOUTH ONCE DAILY (NEEDS  TO  SCHEDULE  ANNUAL  EXAM) 09/29/17  Yes Malachy Mood, MD  ipratropium (ATROVENT) 0.06 % nasal spray U 2 SPRAYS IEN TID 09/25/17   [provider]  IRON PO Take 1 tablet by mouth daily.    [provider]  ketoconazole (NIZORAL) 2 % shampoo  09/14/17   [provider]  montelukast (SINGULAIR) 10 MG tablet  09/14/17   [provider]    Physical Exam Vitals: Blood pressure 132/84, pulse 92, height 5\' 2"  (1.575 m), weight 194 lb (88 kg), not currently breastfeeding.  General: NAD HEENT: normocephalic, anicteric Thyroid: no enlargement, no palpable nodules Pulmonary: No increased work of breathing, CTAB Cardiovascular: RRR, distal pulses 2+ Breast: Breast symmetrical, no tenderness, no palpable nodules or masses, no skin or nipple retraction present, no nipple discharge.  No axillary or supraclavicular lymphadenopathy. Abdomen: NABS, soft, non-tender, non-distended.  Umbilicus without lesions.  No hepatomegaly, splenomegaly or masses palpable. No evidence of hernia  Genitourinary:  External: Normal external female genitalia.  Normal urethral meatus, normal Bartholin's and Skene's glands.    Vagina: Normal vaginal mucosa, no evidence of prolapse.    Cervix: Grossly normal in appearance, no bleeding  Uterus: Non-enlarged, mobile, normal contour.  No CMT  Adnexa: ovaries non-enlarged, no adnexal masses  Rectal:  deferred  Lymphatic: no evidence of inguinal lymphadenopathy Extremities: no edema, erythema, or tenderness Neurologic: Grossly intact Psychiatric: mood appropriate, affect full  Female chaperone present for pelvic and breast  portions of the physical exam      Assessment: 42 y.o. G2P1002 routine annual exam  Plan: Problem List Items Addressed This Visit    None    Visit Diagnoses    Screening for malignant neoplasm of cervix       Relevant Orders   PapIG, HPV, rfx 16/18   Breast screening       Relevant Orders   MM DIGITAL SCREENING BILATERAL   Encounter for gynecological examination without abnormal finding       Relevant Orders   PapIG, HPV, rfx 16/18   CIN I (cervical intraepithelial neoplasia I)       Relevant Orders   PapIG, HPV, rfx 16/18      1) Mammogram - recommend yearly screening mammogram.  Mammogram Was ordered today   2) STI screening  was notoffered and therefore not obtained  3) ASCCP guidelines and rational discussed.  Repeat pap today following up on CIN I result on colposcopy last year.  4) Contraception - the patient is currently using  oral progesterone-only contraceptive.  She is happy with her current form of contraception and plans to continue  5) Colonoscopy -- Screening recommended starting at age 14 for average risk individuals, age 42 for individuals deemed at increased risk (including African Americans) and recommended to continue until age 87.  For patient age 18-85 individualized approach is recommended.  Gold standard screening is via colonoscopy, Cologuard screening is an acceptable alternative for patient unwilling or unable to undergo colonoscopy.  "Colorectal cancer screening for average?risk adults: 2018 guideline update from the American Cancer Society"CA: A Cancer Journal for Clinicians: Oct 12, 2016   6) Routine healthcare maintenance including cholesterol, diabetes screening discussed managed by PCP  7) Return in 1 year (on  10/31/2018) for annual.   Malachy Mood, MD, [facility], [facility] 10/30/2017, 10:12 AM

## 2017-11-03 LAB — PAPIG, HPV, RFX 16/18
HPV, high-risk: POSITIVE — AB
PAP Smear Comment: 0

## 2017-11-07 ENCOUNTER — Encounter (INDEPENDENT_AMBULATORY_CARE_PROVIDER_SITE_OTHER): Payer: Self-pay

## 2017-11-08 ENCOUNTER — Telehealth: Payer: Self-pay | Admitting: Obstetrics and Gynecology

## 2017-11-08 NOTE — Telephone Encounter (Signed)
CALLED PT AND SCHEDULED.

## 2017-11-08 NOTE — Telephone Encounter (Signed)
Called and left voice mail for patient to call back to be schedule °

## 2017-11-08 NOTE — Telephone Encounter (Signed)
-----   Message from Malachy Mood, MD sent at 11/07/2017  5:49 PM EDT ----- Regarding: Colposcopy Colposcopy in the next 2-4 weeks

## 2017-11-14 ENCOUNTER — Ambulatory Visit
Admission: RE | Admit: 2017-11-14 | Discharge: 2017-11-14 | Disposition: A | Discharge status: Home / Self Care | Payer: BC Managed Care – PPO | Source: Ambulatory Visit | Attending: Obstetrics and Gynecology | Admitting: Obstetrics and Gynecology

## 2017-11-14 DIAGNOSIS — Z1231 Encounter for screening mammogram for malignant neoplasm of breast: Secondary | ICD-10-CM

## 2017-11-23 ENCOUNTER — Inpatient Hospital Stay
Admission: RE | Admit: 2017-11-23 | Discharge: 2017-11-23 | Disposition: A | Discharge status: Home / Self Care | Payer: Self-pay | Source: Ambulatory Visit | Attending: *Deleted | Admitting: *Deleted

## 2017-11-23 ENCOUNTER — Other Ambulatory Visit: Payer: Self-pay | Admitting: *Deleted

## 2017-11-23 DIAGNOSIS — Z9289 Personal history of other medical treatment: Secondary | ICD-10-CM

## 2017-11-24 ENCOUNTER — Encounter (INDEPENDENT_AMBULATORY_CARE_PROVIDER_SITE_OTHER): Payer: Self-pay

## 2017-12-01 ENCOUNTER — Ambulatory Visit (INDEPENDENT_AMBULATORY_CARE_PROVIDER_SITE_OTHER): Payer: BC Managed Care – PPO | Admitting: Obstetrics and Gynecology

## 2017-12-01 ENCOUNTER — Encounter: Payer: Self-pay | Admitting: Obstetrics and Gynecology

## 2017-12-01 ENCOUNTER — Other Ambulatory Visit (HOSPITAL_COMMUNITY)
Admission: RE | Admit: 2017-12-01 | Discharge: 2017-12-01 | Disposition: A | Discharge status: Home / Self Care | Payer: BC Managed Care – PPO | Source: Ambulatory Visit | Attending: Obstetrics and Gynecology | Admitting: Obstetrics and Gynecology

## 2017-12-01 VITALS — BP 142/80 | HR 92 | Ht 63.0 in | Wt 190.0 lb

## 2017-12-01 DIAGNOSIS — R87810 Cervical high risk human papillomavirus (HPV) DNA test positive: Secondary | ICD-10-CM

## 2017-12-01 DIAGNOSIS — R87610 Atypical squamous cells of undetermined significance on cytologic smear of cervix (ASC-US): Secondary | ICD-10-CM

## 2017-12-01 NOTE — Progress Notes (Signed)
   GYNECOLOGY CLINIC COLPOSCOPY PROCEDURE NOTE  42 y.o. [id number] here for colposcopy for ASCUS with POSITIVE high risk HPV  pap smear on 10/30/2017. 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  Preceding pap 08/25/2016 LSIL HPV positive with colposcopy 09/15/2016 CIN I  Is the patient  pregnant: No LMP: No LMP recorded. (Menstrual status: Oral contraceptives). Smoking status:  reports that she has never smoked. She has never used smokeless tobacco. Contraception: oral progesterone-only contraceptive Future fertility desired:  No  Patient given informed consent, signed copy in the chart, time out was performed.  The patient was position in dorsal lithotomy position. Speculum was placed the cervix was visualized.   After application of acetic acid colposcopic inspection of the cervix was undertaken.   Colposcopy adequate, full visualization of transformation zone: Yes no visible lesions; random 12 O'Clock biopsy obtained (site of prior CIN I).   ECC specimen obtained:  Yes  All specimens were labeled and sent to pathology.   Patient was given post procedure instructions.  Will follow up pathology and manage accordingly.  Routine preventative health maintenance measures emphasized.  OBGyn Exam     Malachy Mood, MD, [facility], [facility]

## 2018-10-01 ENCOUNTER — Other Ambulatory Visit: Payer: Self-pay | Admitting: Obstetrics and Gynecology

## 2018-10-01 NOTE — Telephone Encounter (Signed)
Pt needs to schedule annual exam in June

## 2018-11-15 ENCOUNTER — Other Ambulatory Visit: Payer: Self-pay | Admitting: Family Medicine

## 2018-11-15 DIAGNOSIS — Z1231 Encounter for screening mammogram for malignant neoplasm of breast: Secondary | ICD-10-CM

## 2018-11-29 ENCOUNTER — Ambulatory Visit
Admission: RE | Admit: 2018-11-29 | Discharge: 2018-11-29 | Disposition: A | Discharge status: Home / Self Care | Payer: BC Managed Care – PPO | Source: Ambulatory Visit | Attending: Family Medicine | Admitting: Family Medicine

## 2018-11-29 ENCOUNTER — Other Ambulatory Visit: Payer: Self-pay

## 2018-11-29 DIAGNOSIS — Z1231 Encounter for screening mammogram for malignant neoplasm of breast: Secondary | ICD-10-CM

## 2018-12-03 ENCOUNTER — Other Ambulatory Visit: Payer: Self-pay | Admitting: Family Medicine

## 2018-12-03 DIAGNOSIS — R928 Other abnormal and inconclusive findings on diagnostic imaging of breast: Secondary | ICD-10-CM

## 2018-12-03 DIAGNOSIS — N6310 Unspecified lump in the right breast, unspecified quadrant: Secondary | ICD-10-CM

## 2018-12-10 ENCOUNTER — Ambulatory Visit: Payer: BC Managed Care – PPO

## 2018-12-10 ENCOUNTER — Other Ambulatory Visit: Payer: BC Managed Care – PPO

## 2018-12-12 ENCOUNTER — Ambulatory Visit
Admission: RE | Admit: 2018-12-12 | Discharge: 2018-12-12 | Disposition: A | Discharge status: Home / Self Care | Payer: BC Managed Care – PPO | Source: Ambulatory Visit | Attending: Family Medicine | Admitting: Family Medicine

## 2018-12-12 DIAGNOSIS — R928 Other abnormal and inconclusive findings on diagnostic imaging of breast: Secondary | ICD-10-CM | POA: Diagnosis present

## 2018-12-12 DIAGNOSIS — N6310 Unspecified lump in the right breast, unspecified quadrant: Secondary | ICD-10-CM

## 2018-12-13 ENCOUNTER — Other Ambulatory Visit: Payer: Self-pay | Admitting: Family Medicine

## 2018-12-13 DIAGNOSIS — R928 Other abnormal and inconclusive findings on diagnostic imaging of breast: Secondary | ICD-10-CM

## 2018-12-13 DIAGNOSIS — N6310 Unspecified lump in the right breast, unspecified quadrant: Secondary | ICD-10-CM

## 2018-12-25 ENCOUNTER — Ambulatory Visit
Admission: RE | Admit: 2018-12-25 | Discharge: 2018-12-25 | Disposition: A | Discharge status: Home / Self Care | Payer: BC Managed Care – PPO | Source: Ambulatory Visit | Attending: Family Medicine | Admitting: Family Medicine

## 2018-12-25 ENCOUNTER — Other Ambulatory Visit: Payer: Self-pay

## 2018-12-25 DIAGNOSIS — R928 Other abnormal and inconclusive findings on diagnostic imaging of breast: Secondary | ICD-10-CM

## 2018-12-25 DIAGNOSIS — N6310 Unspecified lump in the right breast, unspecified quadrant: Secondary | ICD-10-CM

## 2018-12-25 HISTORY — PX: BREAST BIOPSY: SHX20

## 2018-12-26 LAB — SURGICAL PATHOLOGY

## 2019-01-04 ENCOUNTER — Other Ambulatory Visit: Payer: Self-pay | Admitting: Obstetrics and Gynecology

## 2019-01-04 NOTE — Telephone Encounter (Signed)
Advise

## 2019-04-01 ENCOUNTER — Other Ambulatory Visit: Payer: Self-pay | Admitting: Obstetrics and Gynecology

## 2019-07-06 ENCOUNTER — Other Ambulatory Visit: Payer: Self-pay | Admitting: Obstetrics and Gynecology

## 2019-11-11 NOTE — Telephone Encounter (Signed)
Needs colposcopy in the next 2-4 weeks

## 2019-11-11 NOTE — Telephone Encounter (Signed)
Patient is scheduled for 12/03/19 at 10:50 with AMS

## 2019-11-12 ENCOUNTER — Other Ambulatory Visit: Payer: Self-pay | Admitting: Family Medicine

## 2019-11-12 DIAGNOSIS — Z1231 Encounter for screening mammogram for malignant neoplasm of breast: Secondary | ICD-10-CM

## 2019-11-22 ENCOUNTER — Other Ambulatory Visit: Payer: Self-pay | Admitting: Obstetrics and Gynecology

## 2019-12-02 ENCOUNTER — Other Ambulatory Visit: Payer: Self-pay

## 2019-12-02 ENCOUNTER — Ambulatory Visit
Admission: RE | Admit: 2019-12-02 | Discharge: 2019-12-02 | Disposition: A | Discharge status: Home / Self Care | Payer: BC Managed Care – PPO | Source: Ambulatory Visit | Attending: Family Medicine | Admitting: Family Medicine

## 2019-12-02 DIAGNOSIS — Z1231 Encounter for screening mammogram for malignant neoplasm of breast: Secondary | ICD-10-CM | POA: Insufficient documentation

## 2019-12-03 ENCOUNTER — Other Ambulatory Visit: Payer: Self-pay

## 2019-12-03 ENCOUNTER — Encounter: Payer: Self-pay | Admitting: Obstetrics and Gynecology

## 2019-12-03 ENCOUNTER — Ambulatory Visit (INDEPENDENT_AMBULATORY_CARE_PROVIDER_SITE_OTHER): Payer: BC Managed Care – PPO | Admitting: Obstetrics and Gynecology

## 2019-12-03 ENCOUNTER — Other Ambulatory Visit (HOSPITAL_COMMUNITY)
Admission: RE | Admit: 2019-12-03 | Discharge: 2019-12-03 | Disposition: A | Discharge status: Home / Self Care | Payer: BC Managed Care – PPO | Source: Ambulatory Visit | Attending: Obstetrics and Gynecology | Admitting: Obstetrics and Gynecology

## 2019-12-03 VITALS — BP 130/86 | HR 81 | Ht 62.0 in | Wt 179.0 lb

## 2019-12-03 DIAGNOSIS — R87810 Cervical high risk human papillomavirus (HPV) DNA test positive: Secondary | ICD-10-CM | POA: Diagnosis not present

## 2019-12-03 DIAGNOSIS — R87610 Atypical squamous cells of undetermined significance on cytologic smear of cervix (ASC-US): Secondary | ICD-10-CM | POA: Insufficient documentation

## 2019-12-03 NOTE — Progress Notes (Signed)
   GYNECOLOGY CLINIC COLPOSCOPY PROCEDURE NOTE  44 y.o. [id number] here for colposcopy for ASCUS with POSITIVE high risk HPV  pap smear on 10/30/2017, Colposcopy 12/01/2017 negative with normal biopsies, and LGSIL HPV negative pap on 10/29/2019. 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     Is the patient  pregnant: No LMP: No LMP recorded. (Menstrual status: Oral contraceptives). Smoking status:  reports that she has never smoked. She has never used smokeless tobacco.   Patient given informed consent, signed copy in the chart, time out was performed.  The patient was position in dorsal lithotomy position. Speculum was placed the cervix was visualized.   After application of acetic acid colposcopic inspection of the cervix was undertaken.   Colposcopy adequate, full visualization of transformation zone: Yes no visible lesions; corresponding biopsies obtained, 12 O'Clock biopsy obtained.   ECC specimen obtained:  Yes  All specimens were labeled and sent to pathology.   Patient was given post procedure instructions.  Will follow up pathology and manage accordingly.  Routine preventative health maintenance measures emphasized.  OBGyn Exam  Malachy Mood, MD, [facility], [facility]

## 2019-12-04 LAB — SURGICAL PATHOLOGY

## 2020-02-20 ENCOUNTER — Other Ambulatory Visit: Payer: Self-pay | Admitting: Obstetrics and Gynecology

## 2020-02-20 NOTE — Telephone Encounter (Signed)
Advise

## 2020-05-13 ENCOUNTER — Other Ambulatory Visit: Payer: Self-pay | Admitting: Obstetrics and Gynecology

## 2020-07-26 ENCOUNTER — Other Ambulatory Visit: Payer: Self-pay | Admitting: Obstetrics and Gynecology

## 2020-12-31 IMAGING — MG DIGITAL SCREENING BILATERAL MAMMOGRAM WITH TOMO AND CAD
8 series · 8 of 24 positions shown · non-contrast
Comparison: Previous exam(s).

CLINICAL DATA: Screening.

EXAM:
DIGITAL SCREENING BILATERAL MAMMOGRAM WITH TOMO AND CAD

[L MLO synth-2D]
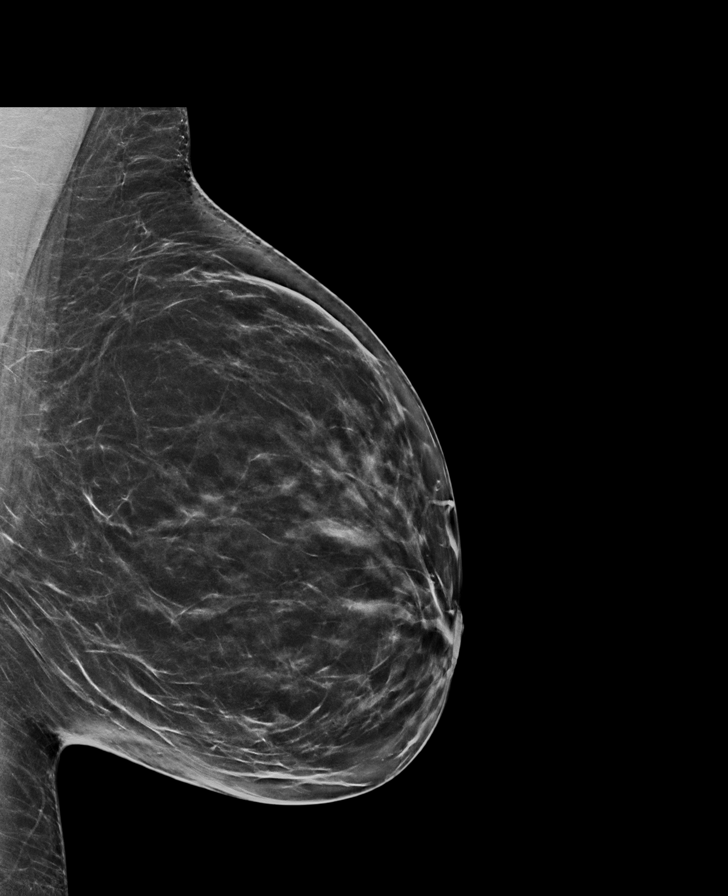

[R CC synth-2D]
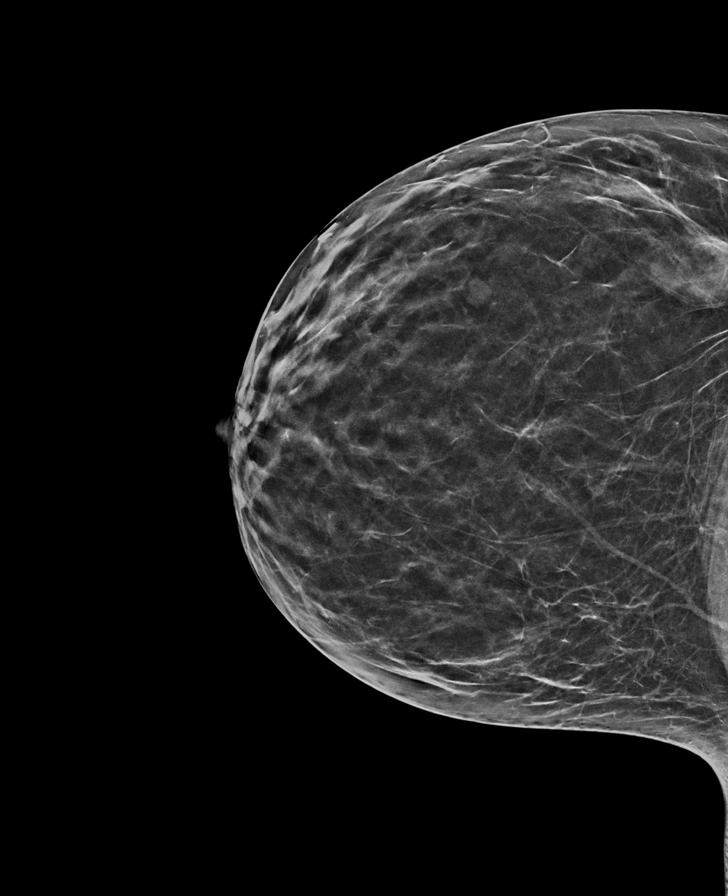

[L CC synth-2D]
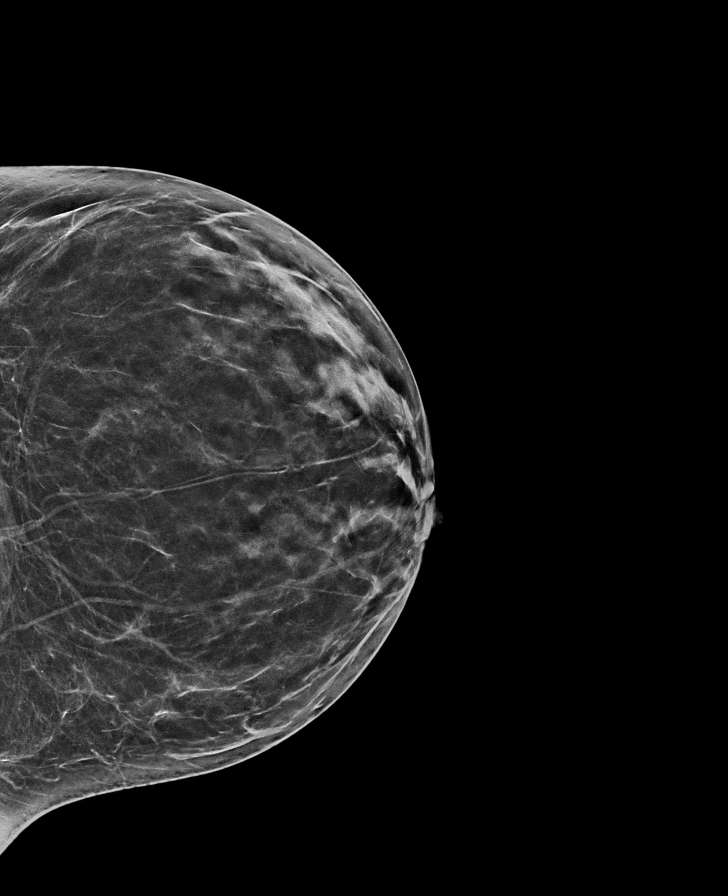

[R MLO synth-2D]
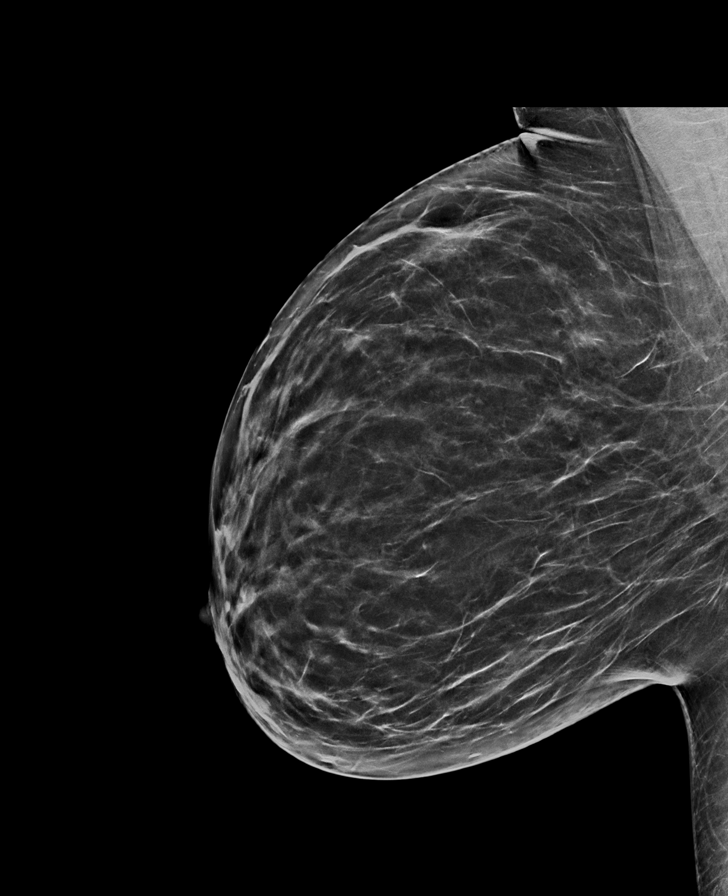

[L CC tomo · tomo slice 33/64.0]
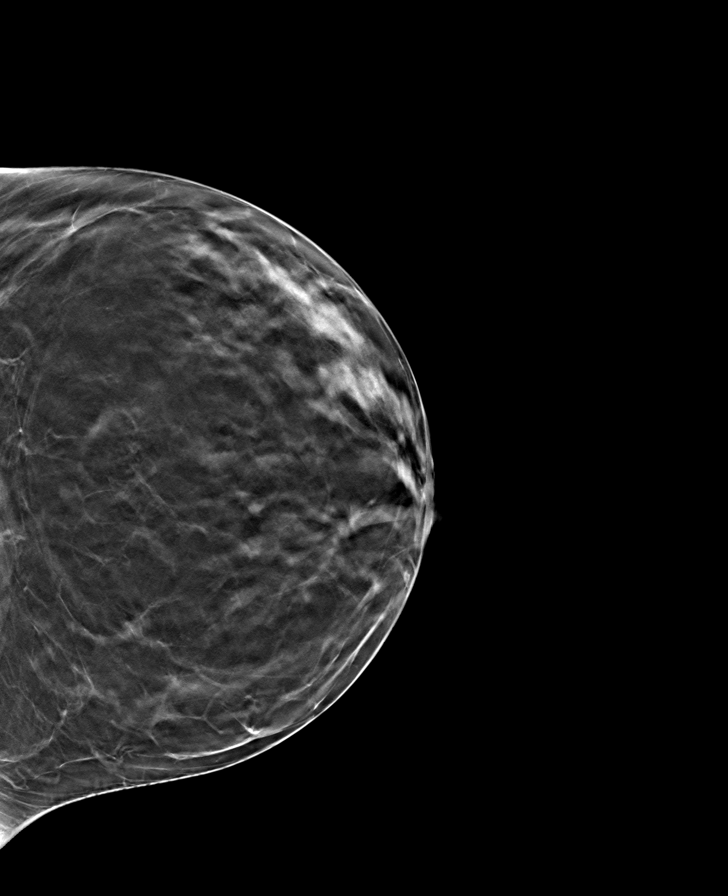

[R CC tomo · tomo slice 31/61.0]
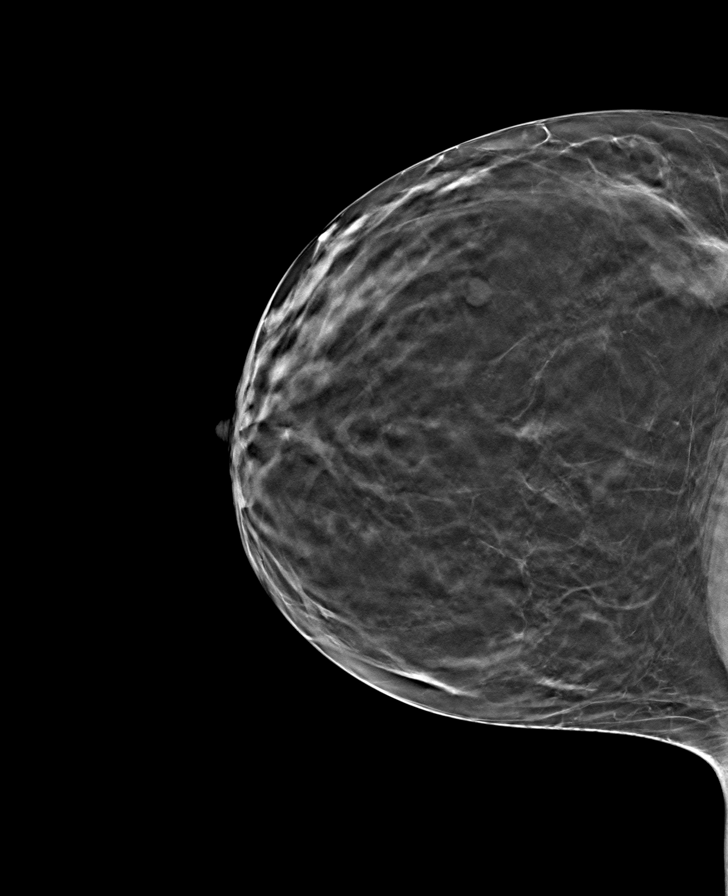

[R MLO tomo · tomo slice 33/66.0]
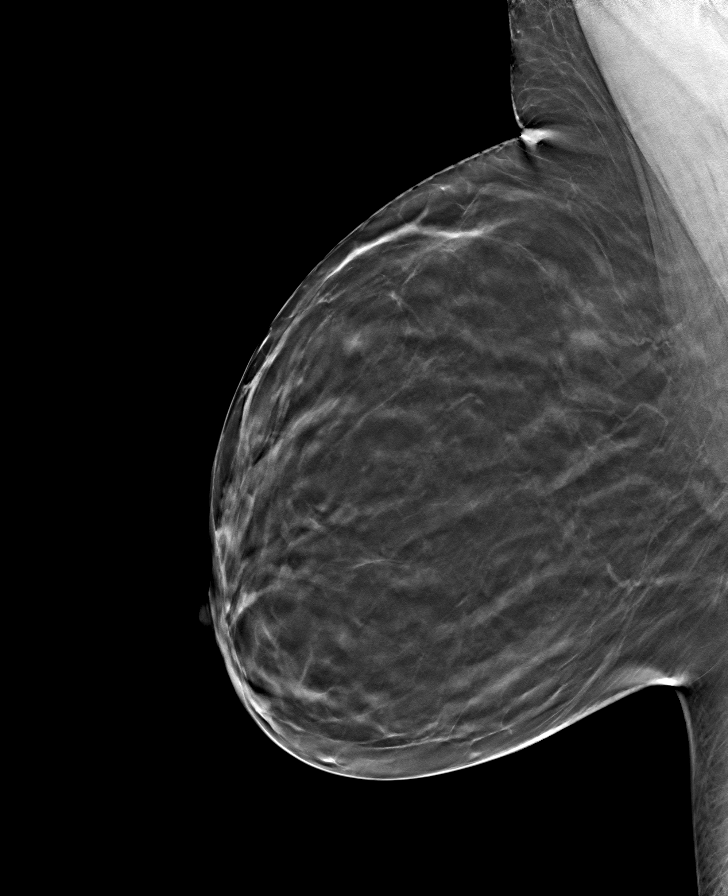

[L MLO tomo · tomo slice 35/70.0]
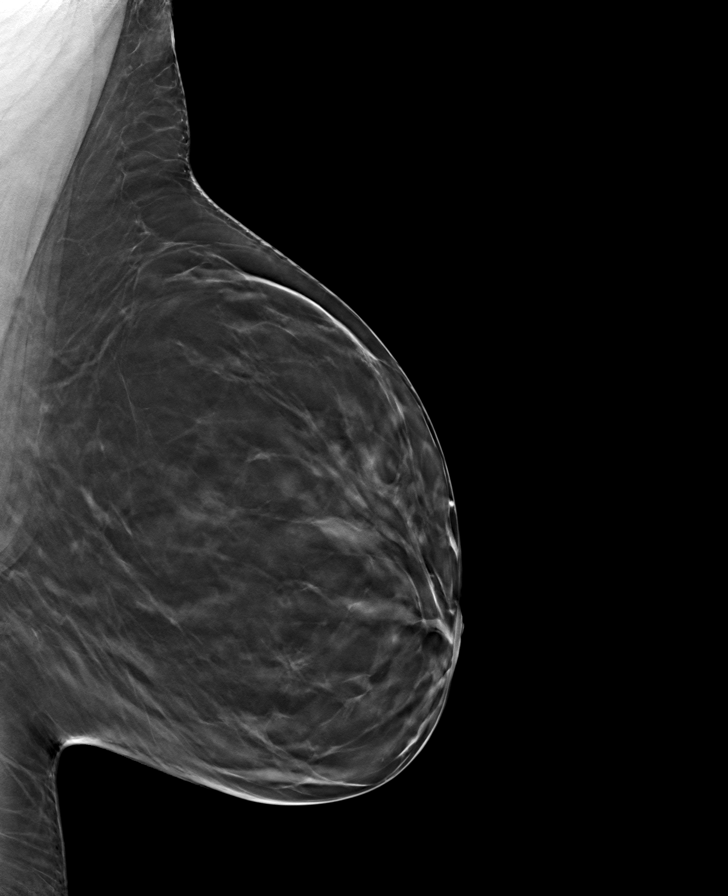

[8 of 24 positions shown; findings below may reference images not displayed]

ACR Breast Density Category b: There are scattered areas of
fibroglandular density.
FINDINGS: In the right breast, a possible mass warrants further evaluation. In
the left breast, no findings suspicious for malignancy. Images were
processed with CAD.
IMPRESSION: Further evaluation is suggested for possible mass in the right
breast.

RECOMMENDATION:
Diagnostic mammogram and possibly ultrasound of the right breast.
(Code:T1-A-550)

The patient will be contacted regarding the findings, and additional
imaging will be scheduled.

BI-RADS CATEGORY  0: Incomplete. Need additional imaging evaluation
and/or prior mammograms for comparison.

## 2021-01-09 ENCOUNTER — Other Ambulatory Visit: Payer: Self-pay | Admitting: Obstetrics and Gynecology

## 2021-02-10 ENCOUNTER — Ambulatory Visit (INDEPENDENT_AMBULATORY_CARE_PROVIDER_SITE_OTHER): Payer: BC Managed Care – PPO | Admitting: Obstetrics and Gynecology

## 2021-02-10 ENCOUNTER — Encounter: Payer: Self-pay | Admitting: Obstetrics and Gynecology

## 2021-02-10 ENCOUNTER — Other Ambulatory Visit: Payer: Self-pay

## 2021-02-10 VITALS — BP 133/87 | Ht 62.0 in | Wt 195.0 lb

## 2021-02-10 DIAGNOSIS — N939 Abnormal uterine and vaginal bleeding, unspecified: Secondary | ICD-10-CM | POA: Diagnosis not present

## 2021-02-10 DIAGNOSIS — Z1239 Encounter for other screening for malignant neoplasm of breast: Secondary | ICD-10-CM

## 2021-02-10 NOTE — Patient Instructions (Signed)
[facility] [address] [place] [address]  [facility] [place]  [address] [place] [address]  Phone: [phone]  

## 2021-02-10 NOTE — Progress Notes (Signed)
Gynecology Abnormal Uterine Bleeding Initial Evaluation   Chief Complaint:  Chief Complaint  Patient presents with   Vaginal Bleeding    Spotting off and on x 1 yr on OCP - RM 3    History of Present Illness:    Paitient is a 45 y.o. G2P1002 who LMP was No LMP recorded. (Menstrual status: Oral contraceptives)., presents today for a problem visit.  She complains of metrorrhagia (midcycle) that  began several weeks ago and its severity is described as mild.  The patient menstrual complaints are acute. She has mostly amenorrhea no norethindrone until recently and they are associated with no menstrual cramping.  The patient is sexually active. She currently uses oral progesterone-only contraceptivefor contraception.  Last Pap results esults were obtained 6/15/2021NIL and HR HPV negative   Previous evaluation: none Prior Diagnosis:  N/A . Previous Treatment: none.    Paramter Normal / Abnormal Prsent  Frequency Am[place]hea     Infrequent (>38 days) X   Normal (?24 days ?38 days)     Freequent (<24 days)    Duration Normal (?8 days) X   Prolonged (>8 days)    Regularity Regular (shortest to longest cycle variation ?7-9 days)*     Irregular (shortest to longest cycle variation ?8-10days)* X  Flow Volume Light X  (Self reported) Normal     Heavy        Intermenstrual Bleeding None     Random X   Cyclical early     Cyclical mid     Cyclical late X      Unscheduled Bleeding  Not applicable    (exogenous hormones) Absent     Present X   FIGO AUB I System: *The available evidence suggests that, using these criteria, the normal range (shortest to longest) varies with age: 19-25 y of age, ?30 d; 72-41 y, ?7 d; and for 67-45 y, ?9 d    Review of Systems: ROS  Past Medical History:  Patient Active Problem List   Diagnosis Date Noted   Postpartum care following cesarean delivery 07/18/2016   [redacted] weeks gestation of pregnancy 07/13/2016   Hypertension affecting pregnancy in third  trimester 07/13/2016   Chronic hypertension in obstetric context, third trimester 07/13/2016   Vasovagal syncope 05/08/2013   Breast cyst 01/29/2013   Breast lump    Vaginal discharge 03/24/2011   HTN (hypertension)    Hyperlipidemia     Past Surgical History:  Past Surgical History:  Procedure Laterality Date   BREAST BIOPSY Right 12/25/2018   u/s bx/clip-neg   BREAST CYST ASPIRATION Right 01/23/2012   benign   CESAREAN SECTION N/A 07/15/2016   Procedure: CESAREAN SECTION;  Surgeon: Malachy Mood, MD;  Location: [facility] ORS;  Service: Obstetrics;  Laterality: N/A;    Obstetric History: G2P1002  Family History:  Family History  Problem Relation Age of Onset   Skin cancer Father    Hypertension Father    Hypertension Mother    Prostate cancer Maternal Grandfather    Breast cancer Neg Hx     Social History:  Social History   Socioeconomic History   Marital status: Married    Spouse name: Not on file   Number of children: Not on file   Years of education: Not on file   Highest education level: Not on file  Occupational History   Occupation: educator  Tobacco Use   Smoking status: Never   Smokeless tobacco: Never  Vaping Use   Vaping Use: Never  used  Substance and Sexual Activity   Alcohol use: Yes    Comment: occasionally   Drug use: No   Sexual activity: Yes    Birth control/protection: Pill  Other Topics Concern   Not on file  Social History Narrative   Not on file   Social Determinants of Health   Financial Resource Strain: Not on file  Food Insecurity: Not on file  Transportation Needs: Not on file  Physical Activity: Not on file  Stress: Not on file  Social Connections: Not on file  Intimate Partner Violence: Not on file    Allergies:  No Known Allergies  Medications: Prior to Admission medications   Medication Sig Start Date End Date Taking? Authorizing Provider  diltiazem (CARDIZEM CD) 120 MG 24 hr capsule Take 1 capsule (120 mg total) by  mouth daily. 0/93/23   Copland, Deirdre Evener, PA-C  hydrochlorothiazide (HYDRODIURIL) 25 MG tablet TAKE 1 TABLET BY MOUTH ONCE DAILY 5/57/32   Copland, Alicia B, PA-C  ipratropium (ATROVENT) 0.06 % nasal spray U 2 SPRAYS IEN TID 09/25/17   [provider]  ketoconazole (NIZORAL) 2 % shampoo  09/14/17   [provider]  norethindrone (AYGESTIN) 5 MG tablet TAKE 1 TABLET BY MOUTH EVERY DAY 01/11/21   Malachy Mood, MD    Physical Exam Blood pressure 133/87, height 5\' 2"  (1.575 m), weight 195 lb (88.5 kg).  No LMP recorded. (Menstrual status: Oral contraceptives).  General: NAD HEENT: normocephalic, anicteric Thyroid: no enlargement, no palpable nodules Pulmonary: No increased work of breathing Cardiovascular: RRR, distal pulses 2+ Abdomen: NABS, soft, non-tender, non-distended.  Umbilicus without lesions.  No hepatomegaly, splenomegaly or masses palpable. No evidence of hernia  Genitourinary:  External: Normal external female genitalia.  Normal urethral meatus, normal Bartholin's and Skene's glands.    Vagina: Normal vaginal mucosa, no evidence of prolapse.    Cervix: Grossly normal in appearance, no bleeding  Uterus: Non-enlarged, mobile, normal contour.  No CMT  Adnexa: ovaries non-enlarged, no adnexal masses  Rectal: deferred  Lymphatic: no evidence of inguinal lymphadenopathy Extremities: no edema, erythema, or tenderness Neurologic: Grossly intact Psychiatric: mood appropriate, affect full  Female chaperone present for pelvic portions of the physical exam  Assessment: 45 y.o. G2P1002 with abnormal uterine bleeding  Plan: Problem List Items Addressed This Visit   None Visit Diagnoses     Breast screening    -  Primary   Relevant Orders   MM 3D SCREEN BREAST BILATERAL   Abnormal uterine bleeding       Relevant Orders   US PELVIC COMPLETE WITH TRANSVAGINAL   TSH (Completed)   Follicle stimulating hormone (Completed)   Estradiol (Completed)   Prolactin  (Completed)       1) Discussed management options for abnormal uterine bleeding including expectant, NSAIDs, tranexamic acid (Lysteda), oral progesterone (Provera, norethindrone, megace), Depo Provera, Levonorgestrel containing IUD, endometrial ablation (Novasure) or hysterectomy as definitive surgical management.  Discussed risks and benefits of each method.   Final management decision will hinge on results of patient's work up and whether an underlying etiology for the patients bleeding symptoms can be discerned.  We will conduct a basic work up examining using the PALM-COIEN classification system.  In the meantime the patient opts to continue norethindrone while we await results of her ultrasound and labs.  The role of unopposed estrogen in the development of endometrial hyperplasia or carcinoma is discussed.  The risk of endometrial hyperplasia is linearly correlated with increasing BMI given the production  of estrone by adipose tissue. Printed patient education handouts were given to the patient to review at home.  Bleeding precautions reviewed.  - suspect most likely etiology is excessive thinning of endometrium from progestin which may be compounded from lower estrogen levels perimenopause.  Polyp is also high in the differential.  If secondary to excessive thinning and decreased ovarian reserve based on FSH/estradiol will consider dose reduction of norethindrone to 2.5mg  po daily or switch to slynd.  2) Return for after [facility] ultrasound.   Malachy Mood, MD, [facility], [facility] 02/10/2021, 3:43 PM

## 2021-02-11 LAB — PROLACTIN: Prolactin: 18 ng/mL (ref 4.8–23.3)

## 2021-02-11 LAB — TSH: TSH: 1.55 u[IU]/mL (ref 0.450–4.500)

## 2021-02-11 LAB — ESTRADIOL: Estradiol: 6.7 pg/mL

## 2021-02-11 LAB — FOLLICLE STIMULATING HORMONE: FSH: 10.4 m[IU]/mL

## 2021-02-23 ENCOUNTER — Ambulatory Visit: Payer: BC Managed Care – PPO

## 2021-03-04 ENCOUNTER — Other Ambulatory Visit: Payer: Self-pay

## 2021-03-04 ENCOUNTER — Ambulatory Visit
Admission: RE | Admit: 2021-03-04 | Discharge: 2021-03-04 | Disposition: A | Discharge status: Home / Self Care | Payer: BC Managed Care – PPO | Source: Ambulatory Visit | Attending: Obstetrics and Gynecology | Admitting: Obstetrics and Gynecology

## 2021-03-04 DIAGNOSIS — Z1231 Encounter for screening mammogram for malignant neoplasm of breast: Secondary | ICD-10-CM | POA: Insufficient documentation

## 2021-03-04 DIAGNOSIS — Z1239 Encounter for other screening for malignant neoplasm of breast: Secondary | ICD-10-CM

## 2021-03-10 ENCOUNTER — Ambulatory Visit: Payer: BC Managed Care – PPO | Admitting: Obstetrics and Gynecology

## 2021-06-21 ENCOUNTER — Telehealth: Payer: Self-pay

## 2021-06-21 NOTE — Telephone Encounter (Signed)
Pt calling for refill; unable to pronounce the name.  [phone]

## 2021-06-21 NOTE — Telephone Encounter (Signed)
Called and left voicemail for patient to call back to be scheduled. 

## 2021-06-21 NOTE — Telephone Encounter (Signed)
Pt returning Sara's call and inquiring about refill.  [phone]

## 2021-06-25 NOTE — Telephone Encounter (Signed)
Called and left voicemail for patient to call back to be scheduled. 

## 2021-06-30 ENCOUNTER — Other Ambulatory Visit: Payer: Self-pay

## 2021-06-30 MED ORDER — NORETHINDRONE ACETATE 5 MG PO TABS: 5.0000 mg | ORAL_TABLET | Freq: Every day | ORAL | 1 refills | Status: AC

## 2021-11-04 ENCOUNTER — Other Ambulatory Visit: Payer: Self-pay | Admitting: Family Medicine

## 2021-11-04 DIAGNOSIS — Z1231 Encounter for screening mammogram for malignant neoplasm of breast: Secondary | ICD-10-CM

## 2022-03-07 ENCOUNTER — Ambulatory Visit
Admission: RE | Admit: 2022-03-07 | Discharge: 2022-03-07 | Disposition: A | Discharge status: Home / Self Care | Payer: BC Managed Care – PPO | Source: Ambulatory Visit | Attending: Family Medicine | Admitting: Family Medicine

## 2022-03-07 DIAGNOSIS — Z1231 Encounter for screening mammogram for malignant neoplasm of breast: Secondary | ICD-10-CM | POA: Diagnosis present

## 2023-01-18 ENCOUNTER — Other Ambulatory Visit: Payer: Self-pay | Admitting: Advanced Practice Midwife

## 2023-02-20 ENCOUNTER — Other Ambulatory Visit: Payer: Self-pay | Admitting: Internal Medicine

## 2023-02-20 DIAGNOSIS — Z1231 Encounter for screening mammogram for malignant neoplasm of breast: Secondary | ICD-10-CM

## 2023-03-09 ENCOUNTER — Ambulatory Visit
Admission: RE | Admit: 2023-03-09 | Discharge: 2023-03-09 | Disposition: A | Discharge status: Home / Self Care | Payer: BC Managed Care – PPO | Source: Ambulatory Visit | Attending: Internal Medicine | Admitting: Internal Medicine

## 2023-03-09 DIAGNOSIS — Z1231 Encounter for screening mammogram for malignant neoplasm of breast: Secondary | ICD-10-CM | POA: Insufficient documentation

## 2024-02-02 ENCOUNTER — Other Ambulatory Visit: Payer: Self-pay | Admitting: Internal Medicine

## 2024-02-02 DIAGNOSIS — Z1231 Encounter for screening mammogram for malignant neoplasm of breast: Secondary | ICD-10-CM

## 2024-03-13 ENCOUNTER — Ambulatory Visit
Admission: RE | Admit: 2024-03-13 | Discharge: 2024-03-13 | Disposition: A | Discharge status: Home / Self Care | Payer: Self-pay | Source: Ambulatory Visit | Attending: Internal Medicine | Admitting: Internal Medicine

## 2024-03-13 DIAGNOSIS — Z1231 Encounter for screening mammogram for malignant neoplasm of breast: Secondary | ICD-10-CM | POA: Insufficient documentation
